# Patient Record
Sex: Female | Born: 1945 | Race: White | Hispanic: No | Marital: Married | Smoking: Never smoker
Health system: Southern US, Community
[De-identification: ages and names within clinical notes are randomized; demographics above are authoritative.]

## PROBLEM LIST (undated history)

## (undated) DIAGNOSIS — R55 Syncope and collapse: Secondary | ICD-10-CM

## (undated) DIAGNOSIS — C50919 Malignant neoplasm of unspecified site of unspecified female breast: Secondary | ICD-10-CM

## (undated) DIAGNOSIS — I10 Essential (primary) hypertension: Secondary | ICD-10-CM

## (undated) DIAGNOSIS — I4892 Unspecified atrial flutter: Secondary | ICD-10-CM

## (undated) HISTORY — PX: TONSILLECTOMY: SUR1361

## (undated) HISTORY — PX: BREAST SURGERY: SHX581

## (undated) HISTORY — PX: VEIN LIGATION: SHX2652

## (undated) HISTORY — PX: MOUTH SURGERY: SHX715

## (undated) HISTORY — DX: Essential (primary) hypertension: I10

---

## 1998-03-10 ENCOUNTER — Ambulatory Visit (HOSPITAL_COMMUNITY): Admission: RE | Admit: 1998-03-10 | Discharge: 1998-03-10 | Payer: Self-pay | Admitting: Surgery

## 1998-04-10 ENCOUNTER — Encounter: Admission: RE | Admit: 1998-04-10 | Discharge: 1998-07-09 | Payer: Self-pay | Admitting: Radiation Oncology

## 2004-02-01 ENCOUNTER — Encounter: Admission: RE | Admit: 2004-02-01 | Discharge: 2004-02-01 | Payer: Self-pay | Admitting: Family Medicine

## 2006-11-13 ENCOUNTER — Encounter: Admission: RE | Admit: 2006-11-13 | Discharge: 2006-11-13 | Payer: Self-pay | Admitting: Family Medicine

## 2008-11-08 ENCOUNTER — Other Ambulatory Visit: Admission: RE | Admit: 2008-11-08 | Discharge: 2008-11-08 | Payer: Self-pay | Admitting: Family Medicine

## 2009-11-14 ENCOUNTER — Other Ambulatory Visit: Admission: RE | Admit: 2009-11-14 | Discharge: 2009-11-14 | Payer: Self-pay | Admitting: Family Medicine

## 2010-02-21 ENCOUNTER — Encounter: Admission: RE | Admit: 2010-02-21 | Discharge: 2010-02-21 | Payer: Self-pay | Admitting: Internal Medicine

## 2010-05-08 ENCOUNTER — Ambulatory Visit (HOSPITAL_COMMUNITY): Admission: RE | Admit: 2010-05-08 | Discharge: 2010-05-08 | Payer: Self-pay | Admitting: Internal Medicine

## 2010-11-22 ENCOUNTER — Other Ambulatory Visit
Admission: RE | Admit: 2010-11-22 | Discharge: 2010-11-22 | Payer: Self-pay | Source: Home / Self Care | Admitting: Family Medicine

## 2011-01-07 ENCOUNTER — Encounter: Payer: Self-pay | Admitting: Family Medicine

## 2012-08-21 ENCOUNTER — Ambulatory Visit (INDEPENDENT_AMBULATORY_CARE_PROVIDER_SITE_OTHER): Payer: BC Managed Care – PPO | Admitting: Family Medicine

## 2012-08-21 DIAGNOSIS — Z23 Encounter for immunization: Secondary | ICD-10-CM

## 2012-11-24 ENCOUNTER — Emergency Department (HOSPITAL_BASED_OUTPATIENT_CLINIC_OR_DEPARTMENT_OTHER)
Admission: EM | Admit: 2012-11-24 | Discharge: 2012-11-24 | Disposition: A | Discharge status: Home / Self Care | Payer: Medicare Other | Attending: Emergency Medicine | Admitting: Emergency Medicine

## 2012-11-24 ENCOUNTER — Encounter (HOSPITAL_BASED_OUTPATIENT_CLINIC_OR_DEPARTMENT_OTHER): Payer: Self-pay | Admitting: *Deleted

## 2012-11-24 DIAGNOSIS — Z0000 Encounter for general adult medical examination without abnormal findings: Secondary | ICD-10-CM

## 2012-11-24 DIAGNOSIS — Z853 Personal history of malignant neoplasm of breast: Secondary | ICD-10-CM | POA: Insufficient documentation

## 2012-11-24 DIAGNOSIS — Z008 Encounter for other general examination: Secondary | ICD-10-CM | POA: Insufficient documentation

## 2012-11-24 HISTORY — DX: Malignant neoplasm of unspecified site of unspecified female breast: C50.919

## 2012-11-24 LAB — BASIC METABOLIC PANEL
Calcium: 10.2 mg/dL (ref 8.4–10.5)
Chloride: 102 mEq/L (ref 96–112)
Creatinine, Ser: 0.7 mg/dL (ref 0.50–1.10)
GFR calc Af Amer: >90 mL/min (ref >90)
GFR calc non Af Amer: 88 mL/min — ABNORMAL LOW (ref >90)

## 2012-11-24 NOTE — ED Notes (Signed)
States she had her pre physical lab work done 3 days ago which showed an elevated potassium. Today she had it repeated which still showed an elevation in her potassium. She is asymptomatic. Was told to come to the ED for treatment.

## 2012-11-24 NOTE — ED Notes (Signed)
Pt is still in triage, not in room 9 at this time.

## 2012-11-24 NOTE — ED Provider Notes (Signed)
History  This chart was scribed for Kaijah Abts B. Bernette Mayers, MD by Ardeen Jourdain, ED Scribe. This patient was seen in room MH09/MH09 and the patient's care was started at 1555.  CSN: [id number]  Arrival date & time 11/24/12  1506   First MD Initiated Contact with Patient 11/24/12 1555      Chief Complaint  Patient presents with  . Follow-up     The history is provided by the patient. No language interpreter was used.    Alexa Hansen is a 66 y.o. female who presents to the Emergency Department complaining of follow up from an elevated potassium result. She reports having her blood drawn 3 days ago and again earlier today, and was told to come to the ED due to the elevated levels. She states she was seen for a physical 3 days ago. She states she has an elevated potassium result at that time and was told to be rechecked today. She states she was flexing and squeezing her hand when her blood was being drawn. She denies any other symptoms at this time including weakness, palpitations and SOB. No prior history of same.    Past Medical History  Diagnosis Date  . Breast cancer     Past Surgical History  Procedure Date  . Tonsillectomy   . Breast surgery     No family history on file.  History  Substance Use Topics  . Smoking status: Never Smoker   . Smokeless tobacco: Not on file  . Alcohol Use: No   No OB history available.   Review of Systems  All other systems reviewed and are negative.   A complete 10 system review of systems was obtained and all systems are negative except as noted in the HPI and PMH.    Allergies  Review of patient's allergies indicates no known allergies.  Home Medications   Current Outpatient Rx  Name  Route  Sig  Dispense  Refill  . BIOTIN PO   Oral   Take by mouth.         Marland Kitchen CALCIUM + D PO   Oral   Take by mouth.           Triage Vitals: BP 144/73  Pulse 102  Temp 98 F (36.7 C) (Oral)  Resp 20  SpO2 100%  Physical Exam   Constitutional: She is oriented to person, place, and time. She appears well-developed and well-nourished.  HENT:  Head: Normocephalic and atraumatic.  Neck: Neck supple.  Pulmonary/Chest: Effort normal.  Neurological: She is alert and oriented to person, place, and time. No cranial nerve deficit.  Psychiatric: She has a normal mood and affect. Her behavior is normal.    ED Course  Procedures (including critical care time)  DIAGNOSTIC STUDIES: Oxygen Saturation is 100% on room air, normal by my interpretation.    COORDINATION OF CARE:  3:57 PM: Lab done here is normal. Suspect lab error caused by vigorous hand squeezing during blood draw. EKG is also normal and she is asymptomatic here. Advised routine PCP followup.    Results for orders placed during the hospital encounter of 11/24/12  BASIC METABOLIC PANEL      Component Value Range   Sodium 140  135 - 145 mEq/L   Potassium 4.2  3.5 - 5.1 mEq/L   Chloride 102  96 - 112 mEq/L   CO2 28  19 - 32 mEq/L   Glucose, Bld 133 (*) 70 - 99 mg/dL   BUN 13  6 - 23 mg/dL   Creatinine, Ser 1.61  0.50 - 1.10 mg/dL   Calcium 09.6  8.4 - 04.5 mg/dL   GFR calc non Af Amer 88 (*) >90 mL/min   GFR calc Af Amer >90  >90 mL/min    No results found.   No diagnosis found.    MDM   Date: 11/24/2012  Rate: 82  Rhythm: normal sinus rhythm  QRS Axis: normal  Intervals: normal  ST/T Wave abnormalities: normal  Conduction Disutrbances: none  Narrative Interpretation: unremarkable         I personally performed the services described in this documentation, which was scribed in my presence. The recorded information has been reviewed and is accurate.     Lakshmi Sundeen B. Bernette Mayers, MD 11/24/12 [phone]

## 2012-11-26 ENCOUNTER — Other Ambulatory Visit (HOSPITAL_COMMUNITY)
Admission: RE | Admit: 2012-11-26 | Discharge: 2012-11-26 | Disposition: A | Discharge status: Home / Self Care | Payer: Medicare Other | Source: Ambulatory Visit | Attending: Family Medicine | Admitting: Family Medicine

## 2012-11-26 ENCOUNTER — Other Ambulatory Visit: Payer: Self-pay | Admitting: Family Medicine

## 2012-11-26 DIAGNOSIS — Z124 Encounter for screening for malignant neoplasm of cervix: Secondary | ICD-10-CM | POA: Insufficient documentation

## 2013-07-22 ENCOUNTER — Other Ambulatory Visit: Payer: Self-pay

## 2013-09-09 ENCOUNTER — Ambulatory Visit (INDEPENDENT_AMBULATORY_CARE_PROVIDER_SITE_OTHER): Payer: Medicare Other | Admitting: Family Medicine

## 2013-09-09 DIAGNOSIS — Z23 Encounter for immunization: Secondary | ICD-10-CM

## 2014-09-16 ENCOUNTER — Ambulatory Visit (INDEPENDENT_AMBULATORY_CARE_PROVIDER_SITE_OTHER): Payer: Medicare HMO | Admitting: *Deleted

## 2014-09-16 DIAGNOSIS — Z23 Encounter for immunization: Secondary | ICD-10-CM

## 2015-06-13 ENCOUNTER — Other Ambulatory Visit: Payer: Self-pay

## 2015-09-20 ENCOUNTER — Ambulatory Visit (INDEPENDENT_AMBULATORY_CARE_PROVIDER_SITE_OTHER): Payer: Medicare HMO | Admitting: *Deleted

## 2015-09-20 DIAGNOSIS — Z23 Encounter for immunization: Secondary | ICD-10-CM | POA: Diagnosis not present

## 2015-10-06 ENCOUNTER — Encounter: Payer: Self-pay | Admitting: Family Medicine

## 2015-10-06 ENCOUNTER — Ambulatory Visit (INDEPENDENT_AMBULATORY_CARE_PROVIDER_SITE_OTHER): Payer: Medicare HMO | Admitting: Family Medicine

## 2015-10-06 VITALS — BP 130/70 | HR 83 | Temp 97.8°F | Resp 18 | Ht 63.0 in | Wt 143.6 lb

## 2015-10-06 DIAGNOSIS — L304 Erythema intertrigo: Secondary | ICD-10-CM

## 2015-10-06 MED ORDER — CLOTRIMAZOLE-BETAMETHASONE 1-0.05 % EX CREA: 1.0000 "application " | TOPICAL_CREAM | Freq: Two times a day (BID) | CUTANEOUS | Status: DC

## 2015-10-06 NOTE — Patient Instructions (Signed)
Eczema Eczema, also called atopic dermatitis, is a skin disorder that causes inflammation of the skin. It causes a red rash and dry, scaly skin. The skin becomes very itchy. Eczema is generally worse during the cooler winter months and often improves with the warmth of summer. Eczema usually starts showing signs in infancy. Some children outgrow eczema, but it may last through adulthood.  CAUSES  The exact cause of eczema is not known, but it appears to run in families. People with eczema often have a family history of eczema, allergies, asthma, or hay fever. Eczema is not contagious. Flare-ups of the condition may be caused by:   Contact with something you are sensitive or allergic to.   Stress. SIGNS AND SYMPTOMS  Dry, scaly skin.   Red, itchy rash.   Itchiness. This may occur before the skin rash and may be very intense.  DIAGNOSIS  The diagnosis of eczema is usually made based on symptoms and medical history. TREATMENT  Eczema cannot be cured, but symptoms usually can be controlled with treatment and other strategies. A treatment plan might include:  Controlling the itching and scratching.   Use over-the-counter antihistamines as directed for itching. This is especially useful at night when the itching tends to be worse.   Use over-the-counter steroid creams as directed for itching.   Avoid scratching. Scratching makes the rash and itching worse. It may also result in a skin infection (impetigo) due to a break in the skin caused by scratching.   Keeping the skin well moisturized with creams every day. This will seal in moisture and help prevent dryness. Lotions that contain alcohol and water should be avoided because they can dry the skin.   Limiting exposure to things that you are sensitive or allergic to (allergens).   Recognizing situations that cause stress.   Developing a plan to manage stress.  HOME CARE INSTRUCTIONS   Only take over-the-counter or  prescription medicines as directed by your health care provider.   Do not use anything on the skin without checking with your health care provider.   Keep baths or showers short (5 minutes) in warm (not hot) water. Use mild cleansers for bathing. These should be unscented. You may add nonperfumed bath oil to the bath water. It is best to avoid soap and bubble bath.   Immediately after a bath or shower, when the skin is still damp, apply a moisturizing ointment to the entire body. This ointment should be a petroleum ointment. This will seal in moisture and help prevent dryness. The thicker the ointment, the better. These should be unscented.   Keep fingernails cut short. Children with eczema may need to wear soft gloves or mittens at night after applying an ointment.   Dress in clothes made of cotton or cotton blends. Dress lightly, because heat increases itching.   A child with eczema should stay away from anyone with fever blisters or cold sores. The virus that causes fever blisters (herpes simplex) can cause a serious skin infection in children with eczema. SEEK MEDICAL CARE IF:   Your itching interferes with sleep.   Your rash gets worse or is not better within 1 week after starting treatment.   You see pus or soft yellow scabs in the rash area.   You have a fever.   You have a rash flare-up after contact with someone who has fever blisters.    This information is not intended to replace advice given to you by your health care   provider. Make sure you discuss any questions you have with your health care provider.   Document Released: 11/30/2000 Document Revised: 09/23/2013 Document Reviewed: 07/06/2013 Elsevier Interactive Patient Education 2016 [employer] Inc.  

## 2015-10-06 NOTE — Progress Notes (Signed)
Subjective:  This chart was scribed for Robyn Haber MD, by Tamsen Roers, at [facility].  This patient was seen in room 3 and the patient's care was started at 2:42 PM.   Chief Complaint  Patient presents with   itching scalp    3-4 weeks     Patient ID: Alexa Hansen, female    DOB: [birth year], 69 y.o.   MRN: [id number]  HPI  HPI Comments: Alexa Hansen is a 69 y.o. female who presents to the [facility] complaining of an itchy scalp behind her ears onset 3-4 weeks ago. Patient notes that she was not able to sleep last night as it was bothering her.  She has no other questions or concerns today. Patient is retired and used to work at a [employer].  She states that she is currently staying active and keeps busy. Patient wears glasses.   There are no active problems to display for this patient.  Past Medical History  Diagnosis Date   Breast cancer [facility])    Past Surgical History  Procedure Laterality Date   Tonsillectomy     Breast surgery     No Known Allergies Prior to Admission medications   Medication Sig Start Date End Date Taking? Authorizing Provider  alendronate (FOSAMAX) 10 MG tablet Take 10 mg by mouth once a week. Take with a full glass of water on an empty stomach.   Yes Historical Provider, MD  BIOTIN PO Take by mouth.   Yes Historical Provider, MD  Calcium Carbonate-Vitamin D (CALCIUM + D PO) Take by mouth.   Yes Historical Provider, MD   Social History   Social History   Marital Status: Married    Spouse Name: N/A   Number of Children: N/A   Years of Education: N/A   Occupational History   Not on file.   Social History Main Topics   Smoking status: Never Smoker    Smokeless tobacco: Not on file   Alcohol Use: No   Drug Use: No   Sexual Activity: Not on file   Other Topics Concern   Not on file   Social History Narrative     Review of Systems  Constitutional: Negative for  fever and chills.  Eyes: Negative for pain, redness and itching.  Respiratory: Negative for cough, choking and shortness of breath.   Gastrointestinal: Negative for nausea, vomiting, diarrhea and constipation.  Musculoskeletal: Negative for neck pain and neck stiffness.  Skin: Positive for rash.       Objective:   Physical Exam  Constitutional: She is oriented to person, place, and time. She appears well-developed and well-nourished. No distress.  HENT:  Head: Normocephalic and atraumatic.  Eyes: Pupils are equal, round, and reactive to light.  Neck: Normal range of motion.  Pulmonary/Chest: No respiratory distress.  Neurological: She is alert and oriented to person, place, and time.  Skin: Skin is warm and dry.   erythema behind the left ear and scaly-ness with a sharp well demarcated border.    Filed Vitals:   10/06/15 1417  BP: 130/70  Pulse: 83  Temp: 97.8 F (36.6 C)  TempSrc: Oral  Resp: 18  Height: 5\' 3"  (1.6 m)  Weight: 143 lb 9.6 oz (65.137 kg)  SpO2: 98%       Assessment & Plan:   This chart was scribed in my presence and reviewed by me personally.    ICD-9-CM ICD-10-CM   1. Eczema  intertrigo 695.89 L30.4 clotrimazole-betamethasone (LOTRISONE) cream     Signed, Robyn Haber, MD

## 2015-10-14 ENCOUNTER — Ambulatory Visit (INDEPENDENT_AMBULATORY_CARE_PROVIDER_SITE_OTHER): Payer: Medicare HMO | Admitting: Emergency Medicine

## 2015-10-14 VITALS — BP 140/78 | HR 74 | Temp 98.4°F | Resp 16 | Ht 63.0 in | Wt 142.8 lb

## 2015-10-14 DIAGNOSIS — L309 Dermatitis, unspecified: Secondary | ICD-10-CM | POA: Diagnosis not present

## 2015-10-14 MED ORDER — TRIAMCINOLONE ACETONIDE 0.1 % EX CREA: 1.0000 "application " | TOPICAL_CREAM | Freq: Two times a day (BID) | CUTANEOUS | Status: DC

## 2015-10-14 MED ORDER — METHYLPREDNISOLONE ACETATE 80 MG/ML IJ SUSP
120.0000 mg | Freq: Once | INTRAMUSCULAR | Status: AC
  Administered 2015-10-14: 120 mg via INTRAMUSCULAR

## 2015-10-14 NOTE — Progress Notes (Signed)
Subjective:  Patient ID: Alexa Hansen, female    DOB: 29-Jan-1946  Age: 69 y.o. MRN: [id number]  CC: Follow-up and Rash   HPI Alexa Hansen presents  with exacerbation of rash. She was treated previously for eczema and that was resolved and now she's had a recurrence. She said it started on the back of her ear. And spread to her scalp now she has a rash on both forearms. She denies any contact with anyone else and socket rash. She denies any new soap shampoo fabrics offered to her detergent lotion or other personal care products no new food or medication.  History Alexa Hansen has a past medical history of Breast cancer ([place]).   She has past surgical history that includes Tonsillectomy and Breast surgery.   Her  family history is not on file.  She   reports that she has never smoked. She does not have any smokeless tobacco history on file. She reports that she does not drink alcohol or use illicit drugs.  Outpatient Prescriptions Prior to Visit  Medication Sig Dispense Refill  . alendronate (FOSAMAX) 10 MG tablet Take 10 mg by mouth once a week. Take with a full glass of water on an empty stomach.    Marland Kitchen BIOTIN PO Take by mouth.    . Calcium Carbonate-Vitamin D (CALCIUM + D PO) Take by mouth.    . clotrimazole-betamethasone (LOTRISONE) cream Apply 1 application topically 2 (two) times daily. 30 g 0   No facility-administered medications prior to visit.    Social History   Social History  . Marital Status: Married    Spouse Name: N/A  . Number of Children: N/A  . Years of Education: N/A   Social History Main Topics  . Smoking status: Never Smoker   . Smokeless tobacco: None  . Alcohol Use: No  . Drug Use: No  . Sexual Activity: Not Asked   Other Topics Concern  . None   Social History Narrative     Review of Systems  Constitutional: Negative for fever, chills and appetite change.  HENT: Negative for congestion, ear pain, postnasal drip, sinus pressure and sore throat.     Eyes: Negative for pain and redness.  Respiratory: Negative for cough, shortness of breath and wheezing.   Cardiovascular: Negative for leg swelling.  Gastrointestinal: Negative for nausea, vomiting, abdominal pain, diarrhea, constipation and blood in stool.  Endocrine: Negative for polyuria.  Genitourinary: Negative for dysuria, urgency, frequency and flank pain.  Musculoskeletal: Negative for gait problem.  Skin: Positive for rash.  Neurological: Negative for weakness and headaches.  Psychiatric/Behavioral: Negative for confusion and decreased concentration. The patient is not nervous/anxious.     Objective:  BP 140/78 mmHg  Pulse 74  Temp(Src) 98.4 F (36.9 C) (Oral)  Resp 16  Ht 5\' 3"  (1.6 m)  Wt 142 lb 12.8 oz (64.774 kg)  BMI 25.30 kg/m2  SpO2 98%  Physical Exam  Constitutional: She is oriented to person, place, and time. She appears well-developed and well-nourished.  HENT:  Head: Normocephalic and atraumatic.  Eyes: Conjunctivae are normal. Pupils are equal, round, and reactive to light.  Pulmonary/Chest: Effort normal.  Musculoskeletal: She exhibits no edema.  Neurological: She is alert and oriented to person, place, and time.  Skin: Skin is dry. Rash noted.  Psychiatric: She has a normal mood and affect. Her behavior is normal. Thought content normal.   He has a rash characteristic of eczema on her arms and back or neck  Assessment & Plan:   Alexa Hansen was seen today for follow-up and rash.  Diagnoses and all orders for this visit:  Eczema -     methylPREDNISolone acetate (DEPO-MEDROL) injection 120 mg; Inject 1.5 mLs (120 mg total) into the muscle once.  Other orders -     triamcinolone cream (KENALOG) 0.1 %; Apply 1 application topically 2 (two) times daily.   I am having Ms. Sole start on triamcinolone cream. I am also having her maintain her Calcium Carbonate-Vitamin D (CALCIUM + D PO), BIOTIN PO, alendronate, and clotrimazole-betamethasone. We  administered methylPREDNISolone acetate.  Meds ordered this encounter  Medications  . methylPREDNISolone acetate (DEPO-MEDROL) injection 120 mg    Sig:   . triamcinolone cream (KENALOG) 0.1 %    Sig: Apply 1 application topically 2 (two) times daily.    Dispense:  30 g    Refill:  2    Appropriate red flag conditions were discussed with the patient as well as actions that should be taken.  Patient expressed his understanding.  Follow-up: Return if symptoms worsen or fail to improve.  Roselee Culver, MD

## 2015-10-14 NOTE — Patient Instructions (Signed)
Eczema Eczema, also called atopic dermatitis, is a skin disorder that causes inflammation of the skin. It causes a red rash and dry, scaly skin. The skin becomes very itchy. Eczema is generally worse during the cooler winter months and often improves with the warmth of summer. Eczema usually starts showing signs in infancy. Some children outgrow eczema, but it may last through adulthood.  CAUSES  The exact cause of eczema is not known, but it appears to run in families. People with eczema often have a family history of eczema, allergies, asthma, or hay fever. Eczema is not contagious. Flare-ups of the condition may be caused by:   Contact with something you are sensitive or allergic to.   Stress. SIGNS AND SYMPTOMS  Dry, scaly skin.   Red, itchy rash.   Itchiness. This may occur before the skin rash and may be very intense.  DIAGNOSIS  The diagnosis of eczema is usually made based on symptoms and medical history. TREATMENT  Eczema cannot be cured, but symptoms usually can be controlled with treatment and other strategies. A treatment plan might include:  Controlling the itching and scratching.   Use over-the-counter antihistamines as directed for itching. This is especially useful at night when the itching tends to be worse.   Use over-the-counter steroid creams as directed for itching.   Avoid scratching. Scratching makes the rash and itching worse. It may also result in a skin infection (impetigo) due to a break in the skin caused by scratching.   Keeping the skin well moisturized with creams every day. This will seal in moisture and help prevent dryness. Lotions that contain alcohol and water should be avoided because they can dry the skin.   Limiting exposure to things that you are sensitive or allergic to (allergens).   Recognizing situations that cause stress.   Developing a plan to manage stress.  HOME CARE INSTRUCTIONS   Only take over-the-counter or  prescription medicines as directed by your health care provider.   Do not use anything on the skin without checking with your health care provider.   Keep baths or showers short (5 minutes) in warm (not hot) water. Use mild cleansers for bathing. These should be unscented. You may add nonperfumed bath oil to the bath water. It is best to avoid soap and bubble bath.   Immediately after a bath or shower, when the skin is still damp, apply a moisturizing ointment to the entire body. This ointment should be a petroleum ointment. This will seal in moisture and help prevent dryness. The thicker the ointment, the better. These should be unscented.   Keep fingernails cut short. Children with eczema may need to wear soft gloves or mittens at night after applying an ointment.   Dress in clothes made of cotton or cotton blends. Dress lightly, because heat increases itching.   A child with eczema should stay away from anyone with fever blisters or cold sores. The virus that causes fever blisters (herpes simplex) can cause a serious skin infection in children with eczema. SEEK MEDICAL CARE IF:   Your itching interferes with sleep.   Your rash gets worse or is not better within 1 week after starting treatment.   You see pus or soft yellow scabs in the rash area.   You have a fever.   You have a rash flare-up after contact with someone who has fever blisters.    This information is not intended to replace advice given to you by your health care   provider. Make sure you discuss any questions you have with your health care provider.   Document Released: 11/30/2000 Document Revised: 09/23/2013 Document Reviewed: 07/06/2013 Elsevier Interactive Patient Education 2016 [employer] Inc.  

## 2015-11-07 DIAGNOSIS — L821 Other seborrheic keratosis: Secondary | ICD-10-CM | POA: Diagnosis not present

## 2015-11-07 DIAGNOSIS — L812 Freckles: Secondary | ICD-10-CM | POA: Diagnosis not present

## 2015-11-07 DIAGNOSIS — D1801 Hemangioma of skin and subcutaneous tissue: Secondary | ICD-10-CM | POA: Diagnosis not present

## 2015-11-07 DIAGNOSIS — L218 Other seborrheic dermatitis: Secondary | ICD-10-CM | POA: Diagnosis not present

## 2015-11-07 DIAGNOSIS — D225 Melanocytic nevi of trunk: Secondary | ICD-10-CM | POA: Diagnosis not present

## 2015-12-21 DIAGNOSIS — H40023 Open angle with borderline findings, high risk, bilateral: Secondary | ICD-10-CM | POA: Diagnosis not present

## 2015-12-29 DIAGNOSIS — R69 Illness, unspecified: Secondary | ICD-10-CM | POA: Diagnosis not present

## 2016-01-03 DIAGNOSIS — M85839 Other specified disorders of bone density and structure, unspecified forearm: Secondary | ICD-10-CM | POA: Diagnosis not present

## 2016-01-03 DIAGNOSIS — M810 Age-related osteoporosis without current pathological fracture: Secondary | ICD-10-CM | POA: Diagnosis not present

## 2016-01-05 DIAGNOSIS — R69 Illness, unspecified: Secondary | ICD-10-CM | POA: Diagnosis not present

## 2016-02-15 DIAGNOSIS — Z136 Encounter for screening for cardiovascular disorders: Secondary | ICD-10-CM | POA: Diagnosis not present

## 2016-02-15 DIAGNOSIS — Z13220 Encounter for screening for lipoid disorders: Secondary | ICD-10-CM | POA: Diagnosis not present

## 2016-02-15 DIAGNOSIS — M810 Age-related osteoporosis without current pathological fracture: Secondary | ICD-10-CM | POA: Diagnosis not present

## 2016-02-15 DIAGNOSIS — Z0000 Encounter for general adult medical examination without abnormal findings: Secondary | ICD-10-CM | POA: Diagnosis not present

## 2016-02-15 DIAGNOSIS — Z853 Personal history of malignant neoplasm of breast: Secondary | ICD-10-CM | POA: Diagnosis not present

## 2016-02-21 DIAGNOSIS — R55 Syncope and collapse: Secondary | ICD-10-CM | POA: Diagnosis not present

## 2016-02-21 DIAGNOSIS — Z1389 Encounter for screening for other disorder: Secondary | ICD-10-CM | POA: Diagnosis not present

## 2016-02-21 DIAGNOSIS — M8588 Other specified disorders of bone density and structure, other site: Secondary | ICD-10-CM | POA: Diagnosis not present

## 2016-02-21 DIAGNOSIS — Z1211 Encounter for screening for malignant neoplasm of colon: Secondary | ICD-10-CM | POA: Diagnosis not present

## 2016-02-21 DIAGNOSIS — R030 Elevated blood-pressure reading, without diagnosis of hypertension: Secondary | ICD-10-CM | POA: Diagnosis not present

## 2016-02-21 DIAGNOSIS — Z0000 Encounter for general adult medical examination without abnormal findings: Secondary | ICD-10-CM | POA: Diagnosis not present

## 2016-02-21 DIAGNOSIS — Z853 Personal history of malignant neoplasm of breast: Secondary | ICD-10-CM | POA: Diagnosis not present

## 2016-02-22 ENCOUNTER — Encounter: Payer: Self-pay | Admitting: Internal Medicine

## 2016-03-20 DIAGNOSIS — I10 Essential (primary) hypertension: Secondary | ICD-10-CM | POA: Diagnosis not present

## 2016-03-20 DIAGNOSIS — Z79899 Other long term (current) drug therapy: Secondary | ICD-10-CM | POA: Diagnosis not present

## 2016-04-16 DIAGNOSIS — R69 Illness, unspecified: Secondary | ICD-10-CM | POA: Diagnosis not present

## 2016-04-17 DIAGNOSIS — Z0000 Encounter for general adult medical examination without abnormal findings: Secondary | ICD-10-CM | POA: Diagnosis not present

## 2016-04-17 DIAGNOSIS — M810 Age-related osteoporosis without current pathological fracture: Secondary | ICD-10-CM | POA: Diagnosis not present

## 2016-04-17 DIAGNOSIS — I10 Essential (primary) hypertension: Secondary | ICD-10-CM | POA: Diagnosis not present

## 2016-04-18 ENCOUNTER — Encounter: Payer: Medicare Other | Admitting: Internal Medicine

## 2016-05-11 ENCOUNTER — Encounter: Payer: Self-pay | Admitting: Family Medicine

## 2016-05-16 DIAGNOSIS — Z853 Personal history of malignant neoplasm of breast: Secondary | ICD-10-CM | POA: Diagnosis not present

## 2016-05-16 DIAGNOSIS — Z1231 Encounter for screening mammogram for malignant neoplasm of breast: Secondary | ICD-10-CM | POA: Diagnosis not present

## 2016-07-19 ENCOUNTER — Encounter: Payer: Self-pay | Admitting: Internal Medicine

## 2016-08-07 DIAGNOSIS — H524 Presbyopia: Secondary | ICD-10-CM | POA: Diagnosis not present

## 2016-08-07 DIAGNOSIS — H35033 Hypertensive retinopathy, bilateral: Secondary | ICD-10-CM | POA: Diagnosis not present

## 2016-08-07 DIAGNOSIS — H40023 Open angle with borderline findings, high risk, bilateral: Secondary | ICD-10-CM | POA: Diagnosis not present

## 2016-08-07 DIAGNOSIS — Z0100 Encounter for examination of eyes and vision without abnormal findings: Secondary | ICD-10-CM | POA: Diagnosis not present

## 2016-08-08 DIAGNOSIS — Z0100 Encounter for examination of eyes and vision without abnormal findings: Secondary | ICD-10-CM | POA: Diagnosis not present

## 2016-09-21 ENCOUNTER — Encounter: Payer: Self-pay | Admitting: Internal Medicine

## 2016-09-21 ENCOUNTER — Ambulatory Visit (AMBULATORY_SURGERY_CENTER): Payer: Self-pay

## 2016-09-21 VITALS — Ht 62.0 in | Wt 141.8 lb

## 2016-09-21 DIAGNOSIS — Z1211 Encounter for screening for malignant neoplasm of colon: Secondary | ICD-10-CM

## 2016-09-21 NOTE — Progress Notes (Signed)
No allergies to eggs or soy No diet meds No home oxygen No past problems with anesthesia  Registered for emmi 

## 2016-09-27 ENCOUNTER — Ambulatory Visit (INDEPENDENT_AMBULATORY_CARE_PROVIDER_SITE_OTHER): Payer: Medicare HMO | Admitting: Physician Assistant

## 2016-09-27 DIAGNOSIS — Z23 Encounter for immunization: Secondary | ICD-10-CM | POA: Diagnosis not present

## 2016-10-02 ENCOUNTER — Encounter: Payer: Self-pay | Admitting: Internal Medicine

## 2016-10-02 ENCOUNTER — Ambulatory Visit (AMBULATORY_SURGERY_CENTER): Payer: Medicare HMO | Admitting: Internal Medicine

## 2016-10-02 VITALS — BP 107/38 | HR 57 | Temp 96.2°F | Resp 14 | Ht 62.0 in | Wt 141.0 lb

## 2016-10-02 DIAGNOSIS — Z1211 Encounter for screening for malignant neoplasm of colon: Secondary | ICD-10-CM | POA: Diagnosis not present

## 2016-10-02 DIAGNOSIS — Z1212 Encounter for screening for malignant neoplasm of rectum: Secondary | ICD-10-CM | POA: Diagnosis not present

## 2016-10-02 DIAGNOSIS — I10 Essential (primary) hypertension: Secondary | ICD-10-CM | POA: Diagnosis not present

## 2016-10-02 MED ORDER — SODIUM CHLORIDE 0.9 % IV SOLN: 500.0000 mL | INTRAVENOUS | Status: DC

## 2016-10-02 NOTE — Patient Instructions (Addendum)
No polyps seen. No cancer seen.  You do have diverticulosis - thickened muscle rings and pouches in the colon wall. Please read the handout about this condition. Also saw hemorrhoids.  I do not recommend further routine repeat colon cancer screening based upon your history, age and findings.  I appreciate the opportunity to care for you. Gatha Mayer, MD, FACG  YOU HAD AN ENDOSCOPIC PROCEDURE TODAY AT [facility]:   Refer to the procedure report that was given to you for any specific questions about what was found during the examination.  If the procedure report does not answer your questions, please call your gastroenterologist to clarify.  If you requested that your care partner not be given the details of your procedure findings, then the procedure report has been included in a sealed envelope for you to review at your convenience later.  YOU SHOULD EXPECT: Some feelings of bloating in the abdomen. Passage of more gas than usual.  Walking can help get rid of the air that was put into your GI tract during the procedure and reduce the bloating. If you had a lower endoscopy (such as a colonoscopy or flexible sigmoidoscopy) you may notice spotting of blood in your stool or on the toilet paper. If you underwent a bowel prep for your procedure, you may not have a normal bowel movement for a few days.  Please Note:  You might notice some irritation and congestion in your nose or some drainage.  This is from the oxygen used during your procedure.  There is no need for concern and it should clear up in a day or so.  SYMPTOMS TO REPORT IMMEDIATELY:   Following lower endoscopy (colonoscopy or flexible sigmoidoscopy):  Excessive amounts of blood in the stool  Significant tenderness or worsening of abdominal pains  Swelling of the abdomen that is new, acute  Fever of 100F or higher   For urgent or emergent issues, a gastroenterologist can be reached at any hour by  calling [phone].   DIET:  We do recommend a small meal at first, but then you may proceed to your regular diet.  Drink plenty of fluids but you should avoid alcoholic beverages for 24 hours.  ACTIVITY:  You should plan to take it easy for the rest of today and you should NOT DRIVE or use heavy machinery until tomorrow (because of the sedation medicines used during the test).    FOLLOW UP: Our staff will call the number listed on your records the next business day following your procedure to check on you and address any questions or concerns that you may have regarding the information given to you following your procedure. If we do not reach you, we will leave a message.  However, if you are feeling well and you are not experiencing any problems, there is no need to return our call.  We will assume that you have returned to your regular daily activities without incident.  If any biopsies were taken you will be contacted by phone or by letter within the next 1-3 weeks.  Please call us at [phone] if you have not heard about the biopsies in 3 weeks.    SIGNATURES/CONFIDENTIALITY: You and/or your care partner have signed paperwork which will be entered into your electronic medical record.  These signatures attest to the fact that that the information above on your After Visit Summary has been reviewed and is understood.  Full responsibility of the confidentiality of  this discharge information lies with you and/or your care-partner.

## 2016-10-02 NOTE — Progress Notes (Signed)
To recovery, report to Tyrell, RN, VSS. 

## 2016-10-02 NOTE — Op Note (Signed)
[facility] Patient Name: Alexa Hansen Procedure Date: 10/02/2016 11:27 AM MRN: [id number] Endoscopist: Gatha Mayer , MD Age: 70 Referring MD:  Date of Birth: 09/01/1946 Gender: Female Account #: [id number] Procedure:                Colonoscopy Indications:              Screening for colorectal malignant neoplasm, This                            is the patient's first colonoscopy Medicines:                Propofol per Anesthesia, Monitored Anesthesia Care Procedure:                Pre-Anesthesia Assessment:                           - Prior to the procedure, a History and Physical                            was performed, and patient medications and                            allergies were reviewed. The patient's tolerance of                            previous anesthesia was also reviewed. The risks                            and benefits of the procedure and the sedation                            options and risks were discussed with the patient.                            All questions were answered, and informed consent                            was obtained. Prior Anticoagulants: The patient has                            taken no previous anticoagulant or antiplatelet                            agents. ASA Grade Assessment: II - A patient with                            mild systemic disease. After reviewing the risks                            and benefits, the patient was deemed in                            satisfactory condition to undergo the procedure.  After obtaining informed consent, the colonoscope                            was passed under direct vision. Throughout the                            procedure, the patient's blood pressure, pulse, and                            oxygen saturations were monitored continuously. The                            Model CF-HQ190L [id number]) scope was introduced   through the anus and advanced to the the cecum,                            identified by appendiceal orifice and ileocecal                            valve. The colonoscopy was somewhat difficult due                            to a tortuous colon. Successful completion of the                            procedure was aided by straightening and shortening                            the scope to obtain bowel loop reduction. The                            patient tolerated the procedure well. The quality                            of the bowel preparation was good. The bowel                            preparation used was Miralax. The ileocecal valve,                            appendiceal orifice, and rectum were photographed. Scope In: 11:36:09 AM Scope Out: 11:48:51 AM Scope Withdrawal Time: 0 hours 9 minutes 2 seconds  Total Procedure Duration: 0 hours 12 minutes 42 seconds  Findings:                 The perianal and digital rectal examinations were                            normal.                           Multiple diverticula were found in the left colon.                           External and  internal hemorrhoids were found during                            retroflexion.                           The exam was otherwise without abnormality on                            direct and retroflexion views. Complications:            No immediate complications. Estimated Blood Loss:     Estimated blood loss: none. Impression:               - Severe diverticulosis in the left colon.                           - External and internal hemorrhoids.                           - The examination was otherwise normal on direct                            and retroflexion views.                           - No specimens collected. Recommendation:           - Patient has a contact number available for                            emergencies. The signs and symptoms of potential                             delayed complications were discussed with the                            patient. Return to normal activities tomorrow.                            Written discharge instructions were provided to the                            patient.                           - Resume previous diet.                           - Continue present medications.                           - No repeat colonoscopy due to age and the absence                            of colonic polyps. Gatha Mayer, MD 10/02/2016 11:54:13 AM This report has been signed electronically.

## 2016-10-03 ENCOUNTER — Telehealth: Payer: Self-pay | Admitting: *Deleted

## 2016-10-03 NOTE — Telephone Encounter (Signed)
  Follow up Call-  Call back number 10/02/2016  Post procedure Call Back phone  # [phone]  Permission to leave phone message Yes  Some recent data might be hidden     Patient questions:  Do you have a fever, pain , or abdominal swelling? No. Pain Score  0 *  Have you tolerated food without any problems? Yes.    Have you been able to return to your normal activities? Yes.    Do you have any questions about your discharge instructions: Diet   No. Medications  No. Follow up visit  No.  Do you have questions or concerns about your Care? No.  Actions: * If pain score is 4 or above: No action needed, pain <4.

## 2016-10-30 DIAGNOSIS — R55 Syncope and collapse: Secondary | ICD-10-CM | POA: Diagnosis not present

## 2016-10-30 DIAGNOSIS — I10 Essential (primary) hypertension: Secondary | ICD-10-CM | POA: Diagnosis not present

## 2016-11-22 DIAGNOSIS — I10 Essential (primary) hypertension: Secondary | ICD-10-CM | POA: Diagnosis not present

## 2016-11-22 DIAGNOSIS — R55 Syncope and collapse: Secondary | ICD-10-CM | POA: Diagnosis not present

## 2016-12-06 DIAGNOSIS — I10 Essential (primary) hypertension: Secondary | ICD-10-CM | POA: Diagnosis not present

## 2016-12-06 DIAGNOSIS — R55 Syncope and collapse: Secondary | ICD-10-CM | POA: Diagnosis not present

## 2016-12-26 DIAGNOSIS — I10 Essential (primary) hypertension: Secondary | ICD-10-CM | POA: Diagnosis not present

## 2016-12-26 DIAGNOSIS — L814 Other melanin hyperpigmentation: Secondary | ICD-10-CM | POA: Diagnosis not present

## 2016-12-26 DIAGNOSIS — D485 Neoplasm of uncertain behavior of skin: Secondary | ICD-10-CM | POA: Diagnosis not present

## 2017-01-07 DIAGNOSIS — D485 Neoplasm of uncertain behavior of skin: Secondary | ICD-10-CM | POA: Diagnosis not present

## 2017-01-07 DIAGNOSIS — L821 Other seborrheic keratosis: Secondary | ICD-10-CM | POA: Diagnosis not present

## 2017-01-09 DIAGNOSIS — I10 Essential (primary) hypertension: Secondary | ICD-10-CM | POA: Diagnosis not present

## 2017-01-09 DIAGNOSIS — L814 Other melanin hyperpigmentation: Secondary | ICD-10-CM | POA: Diagnosis not present

## 2017-01-09 DIAGNOSIS — L821 Other seborrheic keratosis: Secondary | ICD-10-CM | POA: Diagnosis not present

## 2017-01-10 DIAGNOSIS — R55 Syncope and collapse: Secondary | ICD-10-CM | POA: Diagnosis not present

## 2017-01-10 DIAGNOSIS — I10 Essential (primary) hypertension: Secondary | ICD-10-CM | POA: Diagnosis not present

## 2017-01-30 ENCOUNTER — Ambulatory Visit (INDEPENDENT_AMBULATORY_CARE_PROVIDER_SITE_OTHER): Payer: Medicare HMO | Admitting: Urgent Care

## 2017-01-30 VITALS — BP 124/78 | HR 92 | Temp 98.5°F | Resp 17 | Ht 62.5 in | Wt 140.0 lb

## 2017-01-30 DIAGNOSIS — L500 Allergic urticaria: Secondary | ICD-10-CM

## 2017-01-30 DIAGNOSIS — R21 Rash and other nonspecific skin eruption: Secondary | ICD-10-CM

## 2017-01-30 NOTE — Progress Notes (Signed)
    MRN: [id number] DOB: Oct 09, 1946  Subjective:   Alexa Hansen is a 71 y.o. female presenting for chief complaint of Rash  Reports 1 day history of "burning" rash over her torso and is now spreading to limbs and back. Rash is not painful, does not itch, states that she "just knows it is there". Has not tried new foods, taken new medications including recent antibiotic use. No time spent outdoors, insect bites, changed hygiene products. Denies fever, cough, sore throat, facial swelling, tongue swelling, chest pain, chest tightness, n/v, abdominal pain.   Alexa Hansen has a current medication list which includes the following prescription(s): alendronate, biotin, calcium citrate-vitamin d, and lisinopril. Also has No Known Allergies.  Alexa Hansen  has a past medical history of Breast cancer ([place]) and Hypertension. Also  has a past surgical history that includes Tonsillectomy; Breast surgery; Mouth surgery; and Vein ligation.  Objective:   Vitals: BP 124/78 (BP Location: Right Arm, Patient Position: Sitting, Cuff Size: Normal)   Pulse 92   Temp 98.5 F (36.9 C) (Oral)   Resp 17   Ht 5' 2.5" (1.588 m)   Wt 140 lb (63.5 kg)   SpO2 100%   BMI 25.20 kg/m   Physical Exam  Constitutional: She is oriented to person, place, and time. She appears well-developed and well-nourished.  HENT:  Mouth/Throat: Oropharynx is clear and moist.  Eyes: Right eye exhibits no discharge. Left eye exhibits no discharge.  Neck: Normal range of motion. Neck supple.  Cardiovascular: Normal rate, regular rhythm and intact distal pulses.  Exam reveals no gallop and no friction rub.   No murmur heard. Pulmonary/Chest: No respiratory distress. She has no wheezes. She has no rales.  Abdominal: Soft. Bowel sounds are normal. She exhibits no distension and no mass. There is no tenderness. There is no guarding.  Lymphadenopathy:    She has no cervical adenopathy.  Neurological: She is alert and oriented to person, place, and  time.  Skin: Skin is warm and dry. Rash (large patches of urticarial lesions spread out over torso and limbs sparing back, face) noted.   Assessment and Plan :   1. Rash and nonspecific skin eruption 2. Allergic urticaria - Start scheduling benadryl. Switch to Zyrtec and Zantac once rash calms down. RTC if other warning symptoms develop as discussed in clinic.   Jaynee Eagles, PA-C [facility] [phone] 01/30/2017  9:50 AM

## 2017-01-30 NOTE — Patient Instructions (Addendum)
Start taking 25-50mg  benadryl (diphenhydramine) every 4-6 hours. Do this until the rash clears up and then switch to Zyrtec (cetirizine) 10mg  once daily with Zantac (ranitidine) 150mg  twice daily for 1 week.    Hives Hives (urticaria) are itchy, red, swollen areas on your skin. Hives can appear on any part of your body and can vary in size. They can be as small as the tip of a pen or much larger. Hives often fade within 24 hours (acute hives). In other cases, new hives appear after old ones fade. This cycle can continue for several days or weeks (chronic hives). Hives result from your body's reaction to an irritant or to something that you are allergic to (trigger). When you are exposed to a trigger, your body releases a chemical (histamine) that causes redness, itching, and swelling. You can get hives immediately after being exposed to a trigger or hours later. Hives do not spread from person to person (are not contagious). Your hives may get worse with scratching, exercise, and emotional stress. What are the causes? Causes of this condition include:  Allergies to certain foods or ingredients.  Insect bites or stings.  Exposure to pollen or pet dander.  Contact with latex or chemicals.  Spending time in sunlight, heat, or cold (exposure).  Exercise.  Stress. You can also get hives from some medical conditions and treatments. These include:  Viruses, including the common cold.  Bacterial infections, such as urinary tract infections and strep throat.  Disorders such as vasculitis, lupus, or thyroid disease.  Certain medications.  Allergy shots.  Blood transfusions. Sometimes, the cause of hives is not known (idiopathic hives). What increases the risk? This condition is more likely to develop in:  Women.  People who have food allergies, especially to citrus fruits, milk, eggs, peanuts, tree nuts, or shellfish.  People who are allergic  to:  Medicines.  Latex.  Insects.  Animals.  Pollen.  People who have certain medical conditions, includinglupus or thyroid disease. What are the signs or symptoms? The main symptom of this condition is raised, itchyred or white bumps or patches on your skin. These areas may:  Become large and swollen (welts).  Change in shape and location, quickly and repeatedly.  Be separate hives or connect over a large area of skin.  Sting or become painful.  Turn white when pressed in the center (blanch). In severe cases, yourhands, feet, and face may also become swollen. This may occur if hives develop deeper in your skin. How is this diagnosed? This condition is diagnosed based on your symptoms, medical history, and physical exam. Your skin, urine, or blood may be tested to find out what is causing your hives and to rule out other health issues. Your health care provider may also remove a small sample of skin from the affected area and examine it under a microscope (biopsy). How is this treated? Treatment depends on the severity of your condition. Your health care provider may recommend using cool, wet cloths (cool compresses) or taking cool showers to relieve itching. Hives are sometimes treated with medicines, including:  Antihistamines.  Corticosteroids.  Antibiotics.  An injectable medicine (omalizumab). Your health care provider may prescribe this if you have chronic idiopathic hives and you continue to have symptoms even after treatment with antihistamines. Severe cases may require an emergency injection of adrenaline (epinephrine) to prevent a life-threatening allergic reaction (anaphylaxis). Follow these instructions at home: Medicines  Take or apply over-the-counter and prescription medicines only as told by  your health care provider.  If you were prescribed an antibiotic medicine, use it as told by your health care provider. Do not stop taking the antibiotic even if you  start to feel better. Skin Care  Apply cool compresses to the affected areas.  Do not scratch or rub your skin. General instructions  Do not take hot showers or baths. This can make itching worse.  Do not wear tight-fitting clothing.  Use sunscreen and wear protective clothing when you are outside.  Avoid any substances that cause your hives. Keep a journal to help you track what causes your hives. Write down:  What medicines you take.  What you eat and drink.  What products you use on your skin.  Keep all follow-up visits as told by your health care provider. This is important. Contact a health care provider if:  Your symptoms are not controlled with medicine.  Your joints are painful or swollen. Get help right away if:  You have a fever.  You have pain in your abdomen.  Your tongue or lips are swollen.  Your eyelids are swollen.  Your chest or throat feels tight.  You have trouble breathing or swallowing. These symptoms may represent a serious problem that is an emergency. Do not wait to see if the symptoms will go away. Get medical help right away. Call your local emergency services (911 in the [place] Do not drive yourself to the hospital.  This information is not intended to replace advice given to you by your health care provider. Make sure you discuss any questions you have with your health care provider. Document Released: 12/03/2005 Document Revised: 05/02/2016 Document Reviewed: 09/21/2015 Elsevier Interactive Patient Education  2017 [employer].     IF you received an x-ray today, you will receive an invoice from [facility]. Please contact [facility] at [phone] with questions or concerns regarding your invoice.   IF you received labwork today, you will receive an invoice from [facility]. Please contact [facility] at [phone] with questions or concerns regarding your invoice.   Our billing staff will not be able to assist you  with questions regarding bills from these companies.  You will be contacted with the lab results as soon as they are available. The fastest way to get your results is to activate your My Chart account. Instructions are located on the last page of this paperwork. If you have not heard from [place] regarding the results in 2 weeks, please contact this office.

## 2017-02-05 DIAGNOSIS — H35373 Puckering of macula, bilateral: Secondary | ICD-10-CM | POA: Diagnosis not present

## 2017-02-14 DIAGNOSIS — R69 Illness, unspecified: Secondary | ICD-10-CM | POA: Diagnosis not present

## 2017-02-15 DIAGNOSIS — M810 Age-related osteoporosis without current pathological fracture: Secondary | ICD-10-CM | POA: Diagnosis not present

## 2017-02-15 DIAGNOSIS — Z1159 Encounter for screening for other viral diseases: Secondary | ICD-10-CM | POA: Diagnosis not present

## 2017-02-15 DIAGNOSIS — Z79899 Other long term (current) drug therapy: Secondary | ICD-10-CM | POA: Diagnosis not present

## 2017-02-20 DIAGNOSIS — R69 Illness, unspecified: Secondary | ICD-10-CM | POA: Diagnosis not present

## 2017-02-21 DIAGNOSIS — Z853 Personal history of malignant neoplasm of breast: Secondary | ICD-10-CM | POA: Diagnosis not present

## 2017-02-21 DIAGNOSIS — I10 Essential (primary) hypertension: Secondary | ICD-10-CM | POA: Diagnosis not present

## 2017-02-21 DIAGNOSIS — Z0000 Encounter for general adult medical examination without abnormal findings: Secondary | ICD-10-CM | POA: Diagnosis not present

## 2017-02-21 DIAGNOSIS — M810 Age-related osteoporosis without current pathological fracture: Secondary | ICD-10-CM | POA: Diagnosis not present

## 2017-04-17 DIAGNOSIS — M810 Age-related osteoporosis without current pathological fracture: Secondary | ICD-10-CM | POA: Diagnosis not present

## 2017-04-17 DIAGNOSIS — Z79899 Other long term (current) drug therapy: Secondary | ICD-10-CM | POA: Diagnosis not present

## 2017-04-17 DIAGNOSIS — Z7983 Long term (current) use of bisphosphonates: Secondary | ICD-10-CM | POA: Diagnosis not present

## 2017-04-17 DIAGNOSIS — Z6825 Body mass index (BMI) 25.0-25.9, adult: Secondary | ICD-10-CM | POA: Diagnosis not present

## 2017-04-17 DIAGNOSIS — Z0000 Encounter for general adult medical examination without abnormal findings: Secondary | ICD-10-CM | POA: Diagnosis not present

## 2017-04-17 DIAGNOSIS — I10 Essential (primary) hypertension: Secondary | ICD-10-CM | POA: Diagnosis not present

## 2017-05-30 DIAGNOSIS — Z1231 Encounter for screening mammogram for malignant neoplasm of breast: Secondary | ICD-10-CM | POA: Diagnosis not present

## 2017-05-30 DIAGNOSIS — Z853 Personal history of malignant neoplasm of breast: Secondary | ICD-10-CM | POA: Diagnosis not present

## 2017-06-04 ENCOUNTER — Emergency Department (HOSPITAL_COMMUNITY): Payer: Medicare HMO

## 2017-06-04 ENCOUNTER — Observation Stay (HOSPITAL_COMMUNITY)
Admission: EM | Admit: 2017-06-04 | Discharge: 2017-06-05 | Disposition: A | Discharge status: Home / Self Care | Payer: Medicare HMO | Attending: Cardiology | Admitting: Cardiology

## 2017-06-04 ENCOUNTER — Encounter (HOSPITAL_COMMUNITY): Payer: Self-pay | Admitting: Nurse Practitioner

## 2017-06-04 DIAGNOSIS — I10 Essential (primary) hypertension: Secondary | ICD-10-CM | POA: Insufficient documentation

## 2017-06-04 DIAGNOSIS — I4892 Unspecified atrial flutter: Principal | ICD-10-CM | POA: Insufficient documentation

## 2017-06-04 DIAGNOSIS — Z79899 Other long term (current) drug therapy: Secondary | ICD-10-CM | POA: Diagnosis not present

## 2017-06-04 DIAGNOSIS — Z853 Personal history of malignant neoplasm of breast: Secondary | ICD-10-CM | POA: Insufficient documentation

## 2017-06-04 DIAGNOSIS — I484 Atypical atrial flutter: Secondary | ICD-10-CM | POA: Diagnosis not present

## 2017-06-04 DIAGNOSIS — R000 Tachycardia, unspecified: Secondary | ICD-10-CM | POA: Diagnosis not present

## 2017-06-04 HISTORY — DX: Unspecified atrial flutter: I48.92

## 2017-06-04 HISTORY — DX: Syncope and collapse: R55

## 2017-06-04 LAB — BASIC METABOLIC PANEL
ANION GAP: 8 (ref 5–15)
BUN: 14 mg/dL (ref 6–20)
CHLORIDE: 105 mmol/L (ref 101–111)
CO2: 23 mmol/L (ref 22–32)
CREATININE: 0.69 mg/dL (ref 0.44–1.00)
Calcium: 9.2 mg/dL (ref 8.9–10.3)
GFR calc non Af Amer: >60 mL/min (ref >60)
GLUCOSE: 101 mg/dL — AB (ref 65–99)
Potassium: 4.5 mmol/L (ref 3.5–5.1)
Sodium: 136 mmol/L (ref 135–145)

## 2017-06-04 LAB — CBC WITH DIFFERENTIAL/PLATELET
Basophils Absolute: 0 10*3/uL (ref 0.0–0.1)
Basophils Relative: 0 %
Eosinophils Absolute: 0.1 10*3/uL (ref 0.0–0.7)
Eosinophils Relative: 1 %
HEMATOCRIT: 34.9 % — AB (ref 36.0–46.0)
HEMOGLOBIN: 11.5 g/dL — AB (ref 12.0–15.0)
LYMPHS ABS: 1.6 10*3/uL (ref 0.7–4.0)
Lymphocytes Relative: 16 %
MCH: 30.7 pg (ref 26.0–34.0)
MCHC: 33 g/dL (ref 30.0–36.0)
MCV: 93.1 fL (ref 78.0–100.0)
MONO ABS: 1 10*3/uL (ref 0.1–1.0)
MONOS PCT: 10 %
NEUTROS ABS: 7.4 10*3/uL (ref 1.7–7.7)
NEUTROS PCT: 73 %
Platelets: 257 10*3/uL (ref 150–400)
RBC: 3.75 MIL/uL — ABNORMAL LOW (ref 3.87–5.11)
RDW: 13.2 % (ref 11.5–15.5)
WBC: 10 10*3/uL (ref 4.0–10.5)

## 2017-06-04 LAB — TSH: TSH: 0.853 u[IU]/mL (ref 0.350–4.500)

## 2017-06-04 LAB — I-STAT TROPONIN, ED: Troponin i, poc: 0.01 ng/mL (ref 0.00–0.08)

## 2017-06-04 LAB — MRSA PCR SCREENING: MRSA BY PCR: NEGATIVE

## 2017-06-04 MED ORDER — SODIUM CHLORIDE 0.9 % IV SOLN: 250.0000 mL | INTRAVENOUS | Status: DC | PRN

## 2017-06-04 MED ORDER — DEXTROSE 5 % IV SOLN
5.0000 mg/h | INTRAVENOUS | Status: DC
  Administered 2017-06-04: 5 mg/h via INTRAVENOUS
  Filled 2017-06-04: qty 100

## 2017-06-04 MED ORDER — ACETAMINOPHEN 325 MG PO TABS
650.0000 mg | ORAL_TABLET | ORAL | Status: DC | PRN
Start: 2017-06-04 — End: 2017-06-05

## 2017-06-04 MED ORDER — METOPROLOL TARTRATE 5 MG/5ML IV SOLN: 5.0000 mg | Freq: Four times a day (QID) | INTRAVENOUS | Status: DC

## 2017-06-04 MED ORDER — DILTIAZEM HCL 100 MG IV SOLR: 5.0000 mg/h | INTRAVENOUS | Status: DC

## 2017-06-04 MED ORDER — SODIUM CHLORIDE 0.9% FLUSH: 3.0000 mL | INTRAVENOUS | Status: DC | PRN

## 2017-06-04 MED ORDER — SODIUM CHLORIDE 0.9% FLUSH
3.0000 mL | Freq: Two times a day (BID) | INTRAVENOUS | Status: DC
  Administered 2017-06-04 – 2017-06-05 (×2): 3 mL via INTRAVENOUS

## 2017-06-04 MED ORDER — ALENDRONATE SODIUM 70 MG PO TABS: 70.0000 mg | ORAL_TABLET | ORAL | Status: DC

## 2017-06-04 MED ORDER — METOPROLOL TARTRATE 5 MG/5ML IV SOLN
5.0000 mg | Freq: Once | INTRAVENOUS | Status: AC
  Administered 2017-06-04: 5 mg via INTRAVENOUS
  Filled 2017-06-04: qty 5

## 2017-06-04 MED ORDER — DILTIAZEM LOAD VIA INFUSION
10.0000 mg | Freq: Once | INTRAVENOUS | Status: AC
  Administered 2017-06-04: 10 mg via INTRAVENOUS
  Filled 2017-06-04: qty 10

## 2017-06-04 MED ORDER — ONDANSETRON HCL 4 MG/2ML IJ SOLN: 4.0000 mg | Freq: Four times a day (QID) | INTRAMUSCULAR | Status: DC | PRN

## 2017-06-04 MED ORDER — APIXABAN 5 MG PO TABS
5.0000 mg | ORAL_TABLET | Freq: Two times a day (BID) | ORAL | Status: DC
  Administered 2017-06-04 – 2017-06-05 (×2): 5 mg via ORAL
  Filled 2017-06-04 (×2): qty 1

## 2017-06-04 NOTE — ED Notes (Signed)
Attempted report 

## 2017-06-04 NOTE — ED Provider Notes (Signed)
[facility] DEPT Provider Note   CSN: [id number] Arrival date & time: 06/04/17  1631     History   Chief Complaint Chief Complaint  Patient presents with  . Tachycardia    HPI Alexa Hansen is a 71 y.o. female.  Alexa Hansen is a 71 y.o. Female who presents to the ED via EMS with tachycardia. Patient reports yesterday she was taking her blood pressure at home for the first time in several weeks and she noted her heart rate to be above 100 which is unusual. She reports checking her blood pressure again today and noticed her heart rate to be 150. She denies feeling any symptoms like palpitations or fast heart rate. She went to a walk in clinic and they sent her to the ER. She denies any history of atrial flutter or atrial fibrillation. She is not on blood thinners. She has seen cardiologist Dr. Einar Gip previously and most recently saw him 6 months ago. She's been compliant with her blood pressure medications. She does report feeling slightly more fatigued over the past 3 weeks. She denies chest pain, shortness of breath, palpitations, lightheadedness, dizziness, syncope, abdominal pain, nausea, vomiting, or fevers.    The history is provided by the patient and medical records. No language interpreter was used.    Past Medical History:  Diagnosis Date  . Breast cancer ([place])   . Hypertension     Patient Active Problem List   Diagnosis Date Noted  . Atrial flutter by electrocardiogram ([place]) 06/04/2017    Past Surgical History:  Procedure Laterality Date  . BREAST SURGERY    . MOUTH SURGERY     to remove torus  . TONSILLECTOMY    . VEIN LIGATION     Dr Deon Pilling    OB History    No data available       Home Medications    Prior to Admission medications   Medication Sig Start Date End Date Taking? Authorizing Provider  alendronate (FOSAMAX) 70 MG tablet Take 70 mg by mouth once a week. MONDAYS 06/02/17  Yes [provider]  BIOTIN PO Take 1 tablet by mouth  daily.    Yes [provider]  Calcium Carbonate-Vitamin D (CALCIUM + D PO) Take 1 tablet by mouth 2 (two) times daily.    Yes [provider]  lisinopril (PRINIVIL,ZESTRIL) 20 MG tablet Take 20 mg by mouth every evening.  06/01/17  Yes [provider]    Family History Family History  Problem Relation Age of Onset  . Colon cancer Neg Hx     Social History Social History  Substance Use Topics  . Smoking status: Never Smoker  . Smokeless tobacco: Never Used  . Alcohol use No     Allergies   Patient has no known allergies.   Review of Systems Review of Systems  Constitutional: Positive for fatigue. Negative for chills and fever.  HENT: Negative for congestion and sore throat.   Eyes: Negative for visual disturbance.  Respiratory: Negative for cough, shortness of breath and wheezing.   Cardiovascular: Negative for chest pain and palpitations.  Gastrointestinal: Negative for abdominal pain, diarrhea, nausea and vomiting.  Genitourinary: Negative for dysuria.  Musculoskeletal: Negative for back pain and neck pain.  Skin: Negative for rash.  Neurological: Negative for dizziness, syncope, weakness, light-headedness and headaches.     Physical Exam Updated Vital Signs BP 97/63   Pulse 76   Resp 16   Ht 5\' 3"  (1.6 m)  Wt 61.7 kg (136 lb)   SpO2 98%   BMI 24.09 kg/m   Physical Exam  Constitutional: She is oriented to person, place, and time. She appears well-developed and well-nourished. No distress.  Nontoxic appearing.  HENT:  Head: Normocephalic and atraumatic.  Mouth/Throat: Oropharynx is clear and moist.  Eyes: Conjunctivae are normal. Pupils are equal, round, and reactive to light. Right eye exhibits no discharge. Left eye exhibits no discharge.  Neck: Normal range of motion. Neck supple. No JVD present. No tracheal deviation present.  Cardiovascular: Regular rhythm, normal heart sounds and intact distal pulses.  Exam reveals no gallop  and no friction rub.   No murmur heard. Heart rate of 150 on exam. Regular rhythm. A-flutter on the monitor.   Pulmonary/Chest: Effort normal and breath sounds normal. No stridor. No respiratory distress. She has no wheezes. She has no rales.  Lungs are clear to ascultation bilaterally. Symmetric chest expansion bilaterally. No increased work of breathing. No rales or rhonchi.    Abdominal: Soft. There is no tenderness.  Musculoskeletal: She exhibits no edema or tenderness.  No LE edema or TTP.   Lymphadenopathy:    She has no cervical adenopathy.  Neurological: She is alert and oriented to person, place, and time. No sensory deficit. She exhibits normal muscle tone. Coordination normal.  Skin: Skin is warm and dry. Capillary refill takes less than 2 seconds. No rash noted. She is not diaphoretic. No erythema. No pallor.  Psychiatric: She has a normal mood and affect. Her behavior is normal.  Nursing note and vitals reviewed.    ED Treatments / Results  Labs (all labs ordered are listed, but only abnormal results are displayed) Labs Reviewed  BASIC METABOLIC PANEL - Abnormal; Notable for the following:       Result Value   Glucose, Bld 101 (*)    All other components within normal limits  CBC WITH DIFFERENTIAL/PLATELET - Abnormal; Notable for the following:    RBC 3.75 (*)    Hemoglobin 11.5 (*)    HCT 34.9 (*)    All other components within normal limits  TSH  I-STAT TROPOININ, ED    EKG  EKG Interpretation  Date/Time:  Tuesday June 04 2017 16:42:31 EDT Ventricular Rate:  149 PR Interval:    QRS Duration: 166 QT Interval:  373 QTC Calculation: 588 R Axis:   -34 Text Interpretation:  A-flutter  IVCD, consider atypical RBBB Left ventricular hypertrophy Abnormal T, consider ischemia, diffuse leads No STEMI.  Confirmed by Nanda Quinton [phone]) on 06/04/2017 4:52:33 PM       Radiology Dg Chest Port 1 View  Result Date: 06/04/2017 CLINICAL DATA:  Two days of tachycardia.  EXAM: PORTABLE CHEST 1 VIEW COMPARISON:  Chest radiograph February 21, 2010 FINDINGS: Cardiomediastinal silhouette is normal. No pleural effusion or focal consolidation. No pneumothorax. Osteopenia. Broad thoracic levoscoliosis. IMPRESSION: No acute cardiopulmonary process. Electronically Signed   By: Elon Alas M.D.   On: 06/04/2017 17:28    Procedures Procedures (including critical care time)  CRITICAL CARE Performed by: Hanley Hays   Total critical care time: 45 minutes  Critical care time was exclusive of separately billable procedures and treating other patients.  Critical care was necessary to treat or prevent imminent or life-threatening deterioration.  Critical care was time spent personally by me on the following activities: development of treatment plan with patient and/or surrogate as well as nursing, discussions with consultants, evaluation of patient's response to treatment, examination of patient, obtaining history  from patient or surrogate, ordering and performing treatments and interventions, ordering and review of laboratory studies, ordering and review of radiographic studies, pulse oximetry and re-evaluation of patient's condition.   Medications Ordered in ED Medications  diltiazem (CARDIZEM) 1 mg/mL load via infusion 10 mg (10 mg Intravenous Bolus from Bag 06/04/17 1715)    And  diltiazem (CARDIZEM) 100 mg in dextrose 5 % 100 mL (1 mg/mL) infusion (10 mg/hr Intravenous Rate/Dose Change 06/04/17 1809)  alendronate (FOSAMAX) tablet 70 mg (not administered)  acetaminophen (TYLENOL) tablet 650 mg (not administered)  ondansetron (ZOFRAN) injection 4 mg (not administered)  sodium chloride flush (NS) 0.9 % injection 3 mL (not administered)  sodium chloride flush (NS) 0.9 % injection 3 mL (not administered)  0.9 %  sodium chloride infusion (not administered)  apixaban (ELIQUIS) tablet 5 mg (not administered)  metoprolol tartrate (LOPRESSOR) injection 5 mg (5 mg  Intravenous Not Given 06/04/17 1906)  diltiazem (CARDIZEM) 100 mg in dextrose 5 % 100 mL (1 mg/mL) infusion (0 mg/hr Intravenous Hold 06/04/17 1907)  metoprolol tartrate (LOPRESSOR) injection 5 mg (5 mg Intravenous Given 06/04/17 1859)     Initial Impression / Assessment and Plan / ED Course  I have reviewed the triage vital signs and the nursing notes.  Pertinent labs & imaging results that were available during my care of the patient were reviewed by me and considered in my medical decision making (see chart for details).    This  is a 71 y.o. Female who presents to the ED via EMS with tachycardia. Patient reports yesterday she was taking her blood pressure at home for the first time in several weeks and she noted her heart rate to be above 100 which is unusual. She reports checking her blood pressure again today and noticed her heart rate to be 150. She denies feeling any symptoms like palpitations or fast heart rate. She went to a walk in clinic and they sent her to the ER. She denies any history of atrial flutter or atrial fibrillation. She is not on blood thinners. She has seen cardiologist Dr. Einar Gip previously and most recently saw him 6 months ago. She's been compliant with her blood pressure medications. She does report feeling slightly more fatigued over the past 3 weeks. She denies chest pain, palpitations, or syncope.  On exam the patient is afebrile nontoxic appearing. Her HR is 152 on the monitor. A flutter on the monitor. She is good pulses. Blood pressure is 122/78. She is mentating well. She has no complaints.   EKG shows A flutter.  Will start cardizem with a 10 mg bolus and titrate. Patient agrees.   Troponin is not elevated. B MP is unremarkable. CBC is unremarkable. Chest x-ray is unremarkable.  At reevaluation patient's heart rate is still in the 150s. Blood pressure still stable. She has no complaints. Will consult with her cardiologist Dr. Einar Gip.   I consulted with  cardiologist Dr. Einar Gip reports he'll be by to see the patient. Plan for admission.  Dr. Einar Gip down to see the patient. Plans for admission. Patient still doing well on cardizem.   This patient was discussed with and evaluated by Dr. Laverta Baltimore who agrees with assessment and plan.   Final Clinical Impressions(s) / ED Diagnoses   Final diagnoses:  Atrial flutter by electrocardiogram [facility])    New Prescriptions New Prescriptions   No medications on file     Waynetta Pean, Hershal Coria 06/04/17 2021    Margette Fast, MD 06/05/17 [phone]

## 2017-06-04 NOTE — H&P (Addendum)
Alexa Hansen is an 71 y.o. female.   Chief Complaint: Elevated heart rate. HPI: Alexa Hansen  is a 71 y.o. female. She was last seen by me in January 2018 for evaluation of syncope,  2 episodes of syncope, both in March and in November 2017, patient initially was in the shower taking a hot shower, felt dizzy and him out of the shower, had to have a bowel movement and when she stood up, she had an episode of syncope.  Second episode occurred in the morning when she woke up, was making coffee when she felt slightly dizzy and also wanted to go to the bathroom at the same time.  After she had a bowel movement, was she was walking back felt dizzy again and thought that she should go later.  While trying to get back to her bedroom she had an episode of syncope. No further episodes.  At that time she had an echocardiogram which revealed normal LV systolic function and mild diastolic dysfunction.  No stress testing was performed as patient was very active and denied any symptoms of chest pain or shortness of breath.  She is now admitted to the hospital with new onset of atrial flutter with rapid ventricular response.  She noticed that her heart rate had been elevated when she was checking the blood pressure yesterday and also today and called health advisor by telephone through her insurance, she made an appointment to see her PCP and was noted to be atrial flutter with RVR and hence was recommended to go to the emergency room via EMS.  Essentially asymptomatic and denies any chest pain, shortness breath, palpitations, dizziness or syncope. Husband present at bedside. She even went to her routine exercise yesterday.   Past Medical History:  Diagnosis Date  . Breast cancer ([place])   . Hypertension     Past Surgical History:  Procedure Laterality Date  . BREAST SURGERY    . MOUTH SURGERY     to remove torus  . TONSILLECTOMY    . VEIN LIGATION     Dr Deon Pilling    Family History  Problem Relation Age of  Onset  . Colon cancer Neg Hx    Social History:  reports that she has never smoked. She has never used smokeless tobacco. She reports that she does not drink alcohol or use drugs.  Allergies: No Known Allergies  Review of Systems - Negative except for elevated heart rate    Blood pressure (!) 118/98, pulse (!) 150, resp. rate 16, height '5\' 3"'  (1.6 m), weight 61.7 kg (136 lb), SpO2 99 %. Body mass index is 24.09 kg/m.  General appearance: alert, cooperative, appears stated age and no distress Eyes: negative findings: lids and lashes normal Neck: no adenopathy, no carotid bruit, no JVD, supple, symmetrical, trachea midline and thyroid not enlarged, symmetric, no tenderness/mass/nodules Neck: JVP - normal, carotids 2+= without bruits Resp: clear to auscultation bilaterally Chest wall: no tenderness Cardio: regular rate and rhythm and tachycardic, no rub or murmur. GI: soft, non-tender; bowel sounds normal; no masses,  no organomegaly Extremities: extremities normal, atraumatic, no cyanosis or edema Pulses: 2+ and symmetric Skin: Skin color, texture, turgor normal. No rashes or lesions Neurologic: Grossly normal  Results for orders placed or performed during the hospital encounter of 06/04/17 (from the past 48 hour(s))  Basic metabolic panel     Status: Abnormal   Collection Time: 06/04/17  4:56 PM  Result Value Ref Range   Sodium 136  135 - 145 mmol/L   Potassium 4.5 3.5 - 5.1 mmol/L   Chloride 105 101 - 111 mmol/L   CO2 23 22 - 32 mmol/L   Glucose, Bld 101 (H) 65 - 99 mg/dL   BUN 14 6 - 20 mg/dL   Creatinine, Ser 0.69 0.44 - 1.00 mg/dL   Calcium 9.2 8.9 - 10.3 mg/dL   GFR calc non Af Amer >60 >60 mL/min   GFR calc Af Amer >60 >60 mL/min    Comment: (NOTE) The eGFR has been calculated using the CKD EPI equation. This calculation has not been validated in all clinical situations. eGFR's persistently <60 mL/min signify possible Chronic Kidney Disease.    Anion gap 8 5 - 15   CBC with Differential     Status: Abnormal   Collection Time: 06/04/17  4:56 PM  Result Value Ref Range   WBC 10.0 4.0 - 10.5 K/uL   RBC 3.75 (L) 3.87 - 5.11 MIL/uL   Hemoglobin 11.5 (L) 12.0 - 15.0 g/dL   HCT 34.9 (L) 36.0 - 46.0 %   MCV 93.1 78.0 - 100.0 fL   MCH 30.7 26.0 - 34.0 pg   MCHC 33.0 30.0 - 36.0 g/dL   RDW 13.2 11.5 - 15.5 %   Platelets 257 150 - 400 K/uL   Neutrophils Relative % 73 %   Neutro Abs 7.4 1.7 - 7.7 K/uL   Lymphocytes Relative 16 %   Lymphs Abs 1.6 0.7 - 4.0 K/uL   Monocytes Relative 10 %   Monocytes Absolute 1.0 0.1 - 1.0 K/uL   Eosinophils Relative 1 %   Eosinophils Absolute 0.1 0.0 - 0.7 K/uL   Basophils Relative 0 %   Basophils Absolute 0.0 0.0 - 0.1 K/uL  I-stat troponin, ED     Status: None   Collection Time: 06/04/17  5:00 PM  Result Value Ref Range   Troponin i, poc 0.01 0.00 - 0.08 ng/mL   Comment 3            Comment: Due to the release kinetics of cTnI, a negative result within the first hours of the onset of symptoms does not rule out myocardial infarction with certainty. If myocardial infarction is still suspected, repeat the test at appropriate intervals.     Labs:   Lab Results  Component Value Date   WBC 10.0 06/04/2017   HGB 11.5 (L) 06/04/2017   HCT 34.9 (L) 06/04/2017   MCV 93.1 06/04/2017   PLT 257 06/04/2017    Recent Labs Lab 06/04/17 1656  NA 136  K 4.5  CL 105  CO2 23  BUN 14  CREATININE 0.69  CALCIUM 9.2  GLUCOSE 101*    Current Facility-Administered Medications:  .  [COMPLETED] diltiazem (CARDIZEM) 1 mg/mL load via infusion 10 mg, 10 mg, Intravenous, Once, 10 mg at 06/04/17 1715 **AND** diltiazem (CARDIZEM) 100 mg in dextrose 5 % 100 mL (1 mg/mL) infusion, 5-15 mg/hr, Intravenous, Continuous, Dansie, William, PA-C, Last Rate: 10 mL/hr at 06/04/17 1809, 10 mg/hr at 06/04/17 1809  Current Outpatient Prescriptions:  .  alendronate (FOSAMAX) 70 MG tablet, Take 70 mg by mouth once a week. MONDAYS, Disp: ,  Rfl: 2 .  BIOTIN PO, Take 1 tablet by mouth daily. , Disp: , Rfl:  .  Calcium Carbonate-Vitamin D (CALCIUM + D PO), Take 1 tablet by mouth 2 (two) times daily. , Disp: , Rfl:  .  lisinopril (PRINIVIL,ZESTRIL) 20 MG tablet, Take 20 mg by mouth every evening. ,  Disp: , Rfl: 2  CARDIAC STUDIES:  EKG 06/03/2017: Typical atrial flutter with 2:1 AV conduction at the rate of 150 bpm.  No ischemic ST-T abnormality, nonspecific T abnormality evident normal QT interval  Echocardiogram 12/06/2016: Left ventricle cavity is normal in size. Mild concentric hypertrophy of the left ventricle. Normal global wall motion. Doppler evidence of grade I (impaired) diastolic dysfunction, normal LAP. Calculated EF 67%. Left atrial cavity is moderately dilated AP diameter 4.3 cm, but appears larger. Mild tricuspid regurgitation. No evidence of pulmonary hypertension.  Assessment/Plan 1. New onset atrial flutter with 2:1 AV conduction, ventricular rate 150 beats a minute. CHA2DS2-VASCScore: Risk Score  3,  Yearly risk of stroke  3.2. Recommendation: ASA No/Anticoagulation Yes  2. Hypertension  Recommendation: Patient completely asymptomatic and discovered elevated heart rate when checking her blood pressure yesterday and also today. Episode may have been ongoing for longer than 48 hours. I will start her on Eliquis 5 mg by mouth twice a day, also add metoprolol IV for rate control and consider TEE guided direct current cardioversion if she does not control her heart rate. Otherwise anticoagulate for 2 weeks and consider cardioversion and also will refer her to be evaluated by EP as atrial flutter appears to be typical and she may not need long-term anticoagulation. I have discussed these issues with the patient and her husband at the bedside. In spite of elevated heart rate, I do not see any ST-T wave changes of ischemia. However I could certainly arrange for stress test at a later date. I will check her TSH.  I have  also discussed TEE/Cardioversion with the patient and also the risk of aspiration pneumonia, esophageal perforation. Risk of bleeding with Eliquis discussed.   Adrian Prows, MD 06/04/2017, 6:30 PM [facility] [place] Pager: [phone] Office: [phone] If no answer: Cell:  [phone]

## 2017-06-04 NOTE — ED Triage Notes (Signed)
Per EMS pt from [facility] at PCP for atrial flutter at a rate of 150's. Patient noted her HR was elevated yesterday when she was checking her BP and HR and made PCP appointment. Patient denies chest pain, palpitations, dizziness, lightheadedness. Patient noted to be in atrial fibrillation with rapid rate at PCP with no history of such.

## 2017-06-05 ENCOUNTER — Encounter (HOSPITAL_COMMUNITY): Payer: Self-pay | Admitting: Nurse Practitioner

## 2017-06-05 DIAGNOSIS — I4892 Unspecified atrial flutter: Secondary | ICD-10-CM | POA: Diagnosis not present

## 2017-06-05 DIAGNOSIS — I484 Atypical atrial flutter: Secondary | ICD-10-CM | POA: Diagnosis not present

## 2017-06-05 DIAGNOSIS — Z853 Personal history of malignant neoplasm of breast: Secondary | ICD-10-CM | POA: Diagnosis not present

## 2017-06-05 DIAGNOSIS — Z79899 Other long term (current) drug therapy: Secondary | ICD-10-CM | POA: Diagnosis not present

## 2017-06-05 DIAGNOSIS — I10 Essential (primary) hypertension: Secondary | ICD-10-CM | POA: Diagnosis not present

## 2017-06-05 MED ORDER — OFF THE BEAT BOOK
Freq: Once | Status: AC
  Administered 2017-06-05: 08:00:00
  Filled 2017-06-05: qty 1

## 2017-06-05 MED ORDER — METOPROLOL TARTRATE 50 MG PO TABS: 50.0000 mg | ORAL_TABLET | Freq: Two times a day (BID) | ORAL | 1 refills | Status: DC

## 2017-06-05 MED ORDER — METOPROLOL TARTRATE 50 MG PO TABS
50.0000 mg | ORAL_TABLET | Freq: Two times a day (BID) | ORAL | Status: DC
  Administered 2017-06-05: 50 mg via ORAL
  Filled 2017-06-05: qty 1

## 2017-06-05 MED ORDER — APIXABAN 5 MG PO TABS: 5.0000 mg | ORAL_TABLET | Freq: Two times a day (BID) | ORAL | 3 refills | Status: DC

## 2017-06-05 NOTE — Consult Note (Signed)
ELECTROPHYSIOLOGY CONSULT NOTE    Patient ID: Alexa Hansen MRN: [id number], DOB/AGE: [birth year] 71 y.o.  Admit date: 06/04/2017 Date of Consult: 06/05/2017  Primary Physician: Leighton Ruff, MD Primary Cardiologist: Einar Gip  Reason for Consultation: atrial flutter  HPI:  Alexa Hansen is a 71 y.o. female is referred by Dr Einar Gip for evaluation of atrial flutter.  Her past medical history is notable for hypertension. She went to her PCP's office on the day of admission for evaluation of tachycardia which was relatively asymptomatic. She was found to be in new onset atrial flutter and transferred to [facility] for further evaluation.  She has converted on IV diltiazem. EP has been asked to evaluate for consideration of ablation.  While she did not have typical symptoms of atrial flutter, she has noticed intermittent fatigue and exercise intolerance over the last few weeks. These episodes do not have clear triggers and terminate spontaneously. She is not able to qualify them more precisely.    She has also had syncope in the past that has been felt to be vasovagal. Echo done at Dr Irven Shelling office showed normal LV function without significant valvular abnormalities or LA enlargement.    She currently feels well without chest pain, shortness of breath, LE edema, recent nausea, vomiting, fevers, or chills.   Past Medical History:  Diagnosis Date  . Atrial flutter ([place])   . Breast cancer ([place])   . Hypertension   . Syncope      Surgical History:  Past Surgical History:  Procedure Laterality Date  . BREAST SURGERY    . MOUTH SURGERY     to remove torus  . TONSILLECTOMY    . VEIN LIGATION     Dr Deon Pilling     Prescriptions Prior to Admission  Medication Sig Dispense Refill Last Dose  . alendronate (FOSAMAX) 70 MG tablet Take 70 mg by mouth once a week. MONDAYS  2 06/03/2017 at am  . BIOTIN PO Take 1 tablet by mouth daily.    06/04/2017 at am  . Calcium Carbonate-Vitamin D (CALCIUM + D PO)  Take 1 tablet by mouth 2 (two) times daily.    06/04/2017 at am  . lisinopril (PRINIVIL,ZESTRIL) 20 MG tablet Take 20 mg by mouth every evening.   2 06/03/2017 at pm    Inpatient Medications:  . apixaban  5 mg Oral BID  . metoprolol tartrate  50 mg Oral BID  . sodium chloride flush  3 mL Intravenous Q12H    Allergies: No Known Allergies  Social History   Social History  . Marital status: Married    Spouse name: N/A  . Number of children: N/A  . Years of education: N/A   Occupational History  . Not on file.   Social History Main Topics  . Smoking status: Never Smoker  . Smokeless tobacco: Never Used  . Alcohol use No  . Drug use: No  . Sexual activity: No   Other Topics Concern  . Not on file   Social History Narrative  . No narrative on file     Family History  Problem Relation Age of Onset  . Colon cancer Neg Hx      Review of Systems: All other systems reviewed and are otherwise negative except as noted above.  Physical Exam: Vitals:   06/05/17 0100 06/05/17 0401 06/05/17 0735 06/05/17 0800  BP: 103/64 109/68 (!) 89/64 (!) 97/58  Pulse: (!) 57 70 78 (!) 49  Resp: 13 16 18  10  Temp:  98.3 F (36.8 C) 98 F (36.7 C)   TempSrc:  Oral Oral   SpO2: 100% 98% 100% 99%  Weight:  132 lb 7.9 oz (60.1 kg)    Height:        GEN- The patient is well appearing, alert and oriented x 3 today.   HEENT: normocephalic, atraumatic; sclera clear, conjunctiva pink; hearing intact; oropharynx clear; neck supple  Lungs- Clear to ausculation bilaterally, normal work of breathing.  No wheezes, rales, rhonchi Heart- Regular rate and rhythm, frequent ectopy GI- soft, non-tender, non-distended, bowel sounds present Extremities- no clubbing, cyanosis, or edema; DP/PT/radial pulses 2+ bilaterally MS- no significant deformity or atrophy Skin- warm and dry, no rash or lesion Psych- euthymic mood, full affect Neuro- strength and sensation are intact  Labs:   Lab Results    Component Value Date   WBC 10.0 06/04/2017   HGB 11.5 (L) 06/04/2017   HCT 34.9 (L) 06/04/2017   MCV 93.1 06/04/2017   PLT 257 06/04/2017     Recent Labs Lab 06/04/17 1656  NA 136  K 4.5  CL 105  CO2 23  BUN 14  CREATININE 0.69  CALCIUM 9.2  GLUCOSE 101*      Radiology/Studies: Dg Chest Port 1 View  Result Date: 06/04/2017 CLINICAL DATA:  Two days of tachycardia. EXAM: PORTABLE CHEST 1 VIEW COMPARISON:  Chest radiograph February 21, 2010 FINDINGS: Cardiomediastinal silhouette is normal. No pleural effusion or focal consolidation. No pneumothorax. Osteopenia. Broad thoracic levoscoliosis. IMPRESSION: No acute cardiopulmonary process. Electronically Signed   By: Elon Alas M.D.   On: 06/04/2017 17:28    [id number] atrial flutter, V rate 149  (personally reviewed)  TELEMETRY: atrial flutter with conversion to SR without post termination pause (personally reviewed)   Assessment/Plan: 1.  Typical atrial flutter We spent a significant amount of time discussing pathophysiology as well as treatment options. Risks, benefits to ablation reviewed. She has had recurrent atrial flutter this afternoon, but remains asymptomatic.  She would like to proceed with ablation.  Will plan as an outpatient. Dr Lovena Le has also reviewed with patient.  Continue Eliquis until after ablation for [facility] of 3 Flutter ablation scheduled for next Wednesday 06/12/17 with Dr Lovena Le. Instructions entered in AVS  2.  HTN Stable No change required today  Ok from our standpoint to discharge home.   Signed, Chanetta Marshall, NP 06/05/2017 11:11 AM  EP Attending  Patient seen and examined. Agree with above. The patient is a pleasant woman with minimally symptomatic atrial flutter with a RVR who presented and was treated with IV cardizem with subsequent re-initiation of atrial flutter. She has been placed on anti-coagulation. On exam she is a pleasant woman with clear lungs and a regular tachycardia and  no edema. Abdomen is soft and nontender. Neuro is normal ECG reveals probable typical atrial flutter.  A/P 1. Atrial flutter with a RVR - I have discussed the treatment options with the patient and the risks/benefits/goals/expectations of EP study and ablation were reviewed and she will return next week to undergo ablation.  2. coags - she has been started on systemic anti-coagulation.  3. HTN - she appears stable on medical therapy.  Mikle Bosworth.D.

## 2017-06-05 NOTE — Progress Notes (Signed)
Patient has received all discharge information and education. It was also emphasized how important it is not to miss a dose of Eliquis and to take it the morning of her ablation. She has verbalized understanding.

## 2017-06-05 NOTE — Discharge Instructions (Addendum)
Ablation scheduled for Wednesday, 06/12/17 with Dr Corliss Parish to have breakfast morning of procedure Nothing to eat or drink after 9AM Arrive to short stay at 1PM day of procedure Plan to spend one night in the hospital You will need someone to drive home Do not miss any doses of Eliquis between now and procedure.  Cardiac Ablation Cardiac ablation is a procedure to disable (ablate) a small amount of heart tissue in very specific places. The heart has many electrical connections. Sometimes these connections are abnormal and can cause the heart to beat very fast or irregularly. Ablating some of the problem areas can improve the heart rhythm or return it to normal. Ablation may be done for people who:  Have Wolff-Parkinson-White syndrome.  Have fast heart rhythms (tachycardia).  Have taken medicines for an abnormal heart rhythm (arrhythmia) that were not effective or caused side effects.  Have a high-risk heartbeat that may be life-threatening.  During the procedure, a small incision is made in the neck or the groin, and a long, thin, flexible tube (catheter) is inserted into the incision and moved to the heart. Small devices (electrodes) on the tip of the catheter will send out electrical currents. A type of X-ray (fluoroscopy) will be used to help guide the catheter and to provide images of the heart. Tell a health care provider about:  Any allergies you have.  All medicines you are taking, including vitamins, herbs, eye drops, creams, and over-the-counter medicines.  Any problems you or family members have had with anesthetic medicines.  Any blood disorders you have.  Any surgeries you have had.  Any medical conditions you have, such as kidney failure.  Whether you are pregnant or may be pregnant. What are the risks? Generally, this is a safe procedure. However, problems may occur, including:  Infection.  Bruising and bleeding at the catheter insertion site.  Bleeding into  the chest, especially into the sac that surrounds the heart. This is a serious complication.  Stroke or blood clots.  Damage to other structures or organs.  Allergic reaction to medicines or dyes.  Need for a permanent pacemaker if the normal electrical system is damaged. A pacemaker is a small computer that sends electrical signals to the heart and helps your heart beat normally.  The procedure not being fully effective. This may not be recognized until months later. Repeat ablation procedures are sometimes required.  What happens before the procedure?  Follow instructions from your health care provider about eating or drinking restrictions.  Ask your health care provider about: ? Changing or stopping your regular medicines. This is especially important if you are taking diabetes medicines or blood thinners. ? Taking medicines such as aspirin and ibuprofen. These medicines can thin your blood. Do not take these medicines before your procedure if your health care provider instructs you not to.  Plan to have someone take you home from the hospital or clinic.  If you will be going home right after the procedure, plan to have someone with you for 24 hours. What happens during the procedure?  To lower your risk of infection: ? Your health care team will wash or sanitize their hands. ? Your skin will be washed with soap. ? Hair may be removed from the incision area.  An IV tube will be inserted into one of your veins.  You will be given a medicine to help you relax (sedative).  The skin on your neck or groin will be numbed.  An incision  will be made in your neck or your groin.  A needle will be inserted through the incision and into a large vein in your neck or groin.  A catheter will be inserted into the needle and moved to your heart.  Dye may be injected through the catheter to help your surgeon see the area of the heart that needs treatment.  Electrical currents will be  sent from the catheter to ablate heart tissue in desired areas. There are three types of energy that may be used to ablate heart tissue: ? Heat (radiofrequency energy). ? Laser energy. ? Extreme cold (cryoablation).  When the necessary tissue has been ablated, the catheter will be removed.  Pressure will be held on the catheter insertion area to prevent excessive bleeding.  A bandage (dressing) will be placed over the catheter insertion area. The procedure may vary among health care providers and hospitals. What happens after the procedure?  Your blood pressure, heart rate, breathing rate, and blood oxygen level will be monitored until the medicines you were given have worn off.  Your catheter insertion area will be monitored for bleeding. You will need to lie still for a few hours to ensure that you do not bleed from the catheter insertion area.  Do not drive for 24 hours or as long as directed by your health care provider. Summary  Cardiac ablation is a procedure to disable (ablate) a small amount of heart tissue in very specific places. Ablating some of the problem areas can improve the heart rhythm or return it to normal.  During the procedure, electrical currents will be sent from the catheter to ablate heart tissue in desired areas. This information is not intended to replace advice given to you by your health care provider. Make sure you discuss any questions you have with your health care provider. Document Released: 04/21/2009 Document Revised: 10/22/2016 Document Reviewed: 10/22/2016 Elsevier Interactive Patient Education  2018 [place] on my medicine - ELIQUIS (apixaban)  Why was Eliquis prescribed for you? Eliquis was prescribed for you to reduce the risk of a blood clot forming that can cause a stroke if you have a medical condition called atrial fibrillation (a type of irregular heartbeat).  What do You need to know about Eliquis ? Take your  Eliquis TWICE DAILY - one tablet in the morning and one tablet in the evening with or without food. If you have difficulty swallowing the tablet whole please discuss with your pharmacist how to take the medication safely.  Take Eliquis exactly as prescribed by your doctor and DO NOT stop taking Eliquis without talking to the doctor who prescribed the medication.  Stopping may increase your risk of developing a stroke.  Refill your prescription before you run out.  After discharge, you should have regular check-up appointments with your healthcare provider that is prescribing your Eliquis.  In the future your dose may need to be changed if your kidney function or weight changes by a significant amount or as you get older.  What do you do if you miss a dose? If you miss a dose, take it as soon as you remember on the same day and resume taking twice daily.  Do not take more than one dose of ELIQUIS at the same time to make up a missed dose.  Important Safety Information A possible side effect of Eliquis is bleeding. You should call your healthcare provider right away if you experience any of the following: ? Bleeding  from an injury or your nose that does not stop. ? Unusual colored urine (red or dark brown) or unusual colored stools (red or black). ? Unusual bruising for unknown reasons. ? A serious fall or if you hit your head (even if there is no bleeding).  Some medicines may interact with Eliquis and might increase your risk of bleeding or clotting while on Eliquis. To help avoid this, consult your healthcare provider or pharmacist prior to using any new prescription or non-prescription medications, including herbals, vitamins, non-steroidal anti-inflammatory drugs (NSAIDs) and supplements.  This website has more information on Eliquis (apixaban): http://[email or web address].com/eliquis/home

## 2017-06-05 NOTE — Discharge Summary (Signed)
Physician Discharge Summary  Patient ID: Alexa Hansen MRN: [id number] DOB/AGE: 01/04/1946 71 y.o.  Admit date: 06/04/2017 Discharge date: 06/05/2017  Primary Discharge Diagnosis 1. New onset atrial flutter with 2:1 AV conduction, ventricular rate 150 beats a minute. CHA2DS2-VASCScore: Risk Score  3,  Yearly risk of stroke  3.2. Recommendation: ASA No/Anticoagulation Yes  2. Hypertension   Significant Diagnostic Studies: None  Hospital Course: Patient admitted via emergency room when she noticed her heart rate had been elevated by checking a blood pressure but otherwise asymptomatic.  She did spontaneously convert to sinus rhythm with IV diltiazem and also IV metoprolol, however prior to discharge she again developed recurrent atrial flutter but was evaluated by EP, Dr. Crissie Sickles and felt safe for discharge.  She has no other cardiovascular risk factors, she exercises on a regular basis fairly vigorously.  In spite of heart rate of 150 bpm, there was no ST-T wave changes of ischemia   Recommendations on discharge: Patient was scheduled for atrial flutter ablation by Dr. Crissie Sickles on 06/12/2017.  She was discharged home on Eliquis5 mg p.o. B.i.d., metoprolol 50 mg p.o. b.i.d. and lisinopril was discontinued.   Discharge Exam: Blood pressure 129/83, pulse (!) 137, temperature 98.6 F (37 C), temperature source Oral, resp. rate 19, height 5\' 3"  (1.6 m), weight 60.1 kg (132 lb 7.9 oz), SpO2 100 %.   General appearance: alert, cooperative, appears stated age and no distress Resp: clear to auscultation bilaterally Cardio: regular rate and rhythm, S1, S2 normal, no murmur, click, rub or gallop GI: soft, non-tender; bowel sounds normal; no masses,  no organomegaly Extremities: extremities normal, atraumatic, no cyanosis or edema Pulses: 2+ and symmetric Neurologic: Grossly normal Labs:   Lab Results  Component Value Date   WBC 10.0 06/04/2017   HGB 11.5 (L) 06/04/2017   HCT 34.9 (L)  06/04/2017   MCV 93.1 06/04/2017   PLT 257 06/04/2017    Recent Labs Lab 06/04/17 1656  NA 136  K 4.5  CL 105  CO2 23  BUN 14  CREATININE 0.69  CALCIUM 9.2  GLUCOSE 101*   TSH  Recent Labs  06/04/17 1921  TSH 0.853   EKG 06/05/2017: normal EKG, normal sinus rhythm, PAC's noted.   EKG 06/03/2017: Typical atrial flutter with 2:1 AV conduction at the rate of 150 bpm.  No ischemic ST-T abnormality, nonspecific T abnormality evident normal QT interval  Echocardiogram 12/06/2016: Left ventricle cavity is normal in size. Mild concentric hypertrophy of the left ventricle. Normal global wall motion. Doppler evidence of grade I (impaired) diastolic dysfunction, normal LAP. Calculated EF 67%. Left atrial cavity is moderately dilated AP diameter 4.3 cm, but appears larger. Mild tricuspid regurgitation. No evidence of pulmonary hypertension.  Radiology: Dg Chest Port 1 View  Result Date: 06/04/2017 CLINICAL DATA:  Two days of tachycardia. EXAM: PORTABLE CHEST 1 VIEW COMPARISON:  Chest radiograph February 21, 2010 FINDINGS: Cardiomediastinal silhouette is normal. No pleural effusion or focal consolidation. No pneumothorax. Osteopenia. Broad thoracic levoscoliosis. IMPRESSION: No acute cardiopulmonary process. Electronically Signed   By: Elon Alas M.D.   On: 06/04/2017 17:28      FOLLOW UP PLANS AND APPOINTMENTS  Allergies as of 06/05/2017   No Known Allergies     Medication List    STOP taking these medications   lisinopril 20 MG tablet Commonly known as:  PRINIVIL,ZESTRIL     TAKE these medications   alendronate 70 MG tablet Commonly known as:  FOSAMAX Take 70 mg by  mouth once a week. MONDAYS   apixaban 5 MG Tabs tablet Commonly known as:  ELIQUIS Take 1 tablet (5 mg total) by mouth 2 (two) times daily.   BIOTIN PO Take 1 tablet by mouth daily.   CALCIUM + D PO Take 1 tablet by mouth 2 (two) times daily.   metoprolol tartrate 50 MG tablet Commonly known  as:  LOPRESSOR Take 1 tablet (50 mg total) by mouth 2 (two) times daily.      Follow-up Information    Adrian Prows, MD Follow up on 06/20/2017.   Specialty:  Cardiology Why:  4 pm appointment. Arrive 15 minutes early Contact information: [address] [place] [address] [phone]        Evans Lance, MD Follow up.   Specialty:  Cardiology Why:  call our office if you would like to proceed with ablation  Contact information: [address] [place] [address] [phone]          Adrian Prows, MD 06/05/2017, 1:04 PM  Pager: [phone] Office: [phone] If no answer: [phone]

## 2017-06-05 NOTE — Care Management Note (Addendum)
Case Management Note  Patient Details  Name: Alexa Hansen MRN: [id number] Date of Birth: Dec 05, 1946  Subjective/Objective: Pt presented for New Onset Atrial Flutter. Pt is from home with husband. Plan to return home once stable. PTA independent-No DME.                   Action/Plan: No home needs identified. Benefits Check for Eliquis in process and will make pt aware of cost once completed. 30 day free card to be provided as well. No further needs at this time.   Expected Discharge Date:                  Expected Discharge Plan:  Home/Self Care  In-House Referral:  NA  Discharge planning Services  CM Consult, Medication Assistance  Post Acute Care Choice:  NA Choice offered to:  NA  DME Arranged:  N/A DME Agency:  NA  HH Arranged:  NA HH Agency:  NA  Status of Service:  Completed, signed off  If discussed at [place] of Stay Meetings, dates discussed:    Additional Comments: CRYSTAL  @ [facility] PI # [phone]    1. ELIQUIS 2.5 MG BID   COVER- YES  CO-PAY- $ 42.00  TIER- 3 DRUG  PRIOR APPROVAL- NO   2.ELIQUIS 5 MG BID   COVER- YES  CO-PAY- $ 42.00  TIER- 3 DRUG  PRIOR APPROVAL- NO   PREFERRED PHARMACY : [facility] AND [facility]    Bethena Roys, RN 06/05/2017, 12:42 PM

## 2017-06-05 NOTE — Care Management Obs Status (Signed)
[place] NOTIFICATION   Patient Details  Name: Alexa Hansen MRN: [id number] Date of Birth: 1946/10/21   Medicare Observation Status Notification Given:  Yes    Bethena Roys, RN 06/05/2017, 12:42 PM

## 2017-06-12 ENCOUNTER — Encounter (HOSPITAL_COMMUNITY)
Admission: RE | Disposition: A | Discharge status: Home / Self Care | Payer: Self-pay | Source: Ambulatory Visit | Attending: Internal Medicine

## 2017-06-12 ENCOUNTER — Ambulatory Visit (HOSPITAL_COMMUNITY)
Admission: RE | Admit: 2017-06-12 | Discharge: 2017-06-13 | Disposition: A | Discharge status: Home / Self Care | Payer: Medicare HMO | Source: Ambulatory Visit | Attending: Internal Medicine | Admitting: Internal Medicine

## 2017-06-12 DIAGNOSIS — Z7901 Long term (current) use of anticoagulants: Secondary | ICD-10-CM | POA: Diagnosis not present

## 2017-06-12 DIAGNOSIS — I4892 Unspecified atrial flutter: Secondary | ICD-10-CM | POA: Diagnosis present

## 2017-06-12 DIAGNOSIS — I4891 Unspecified atrial fibrillation: Secondary | ICD-10-CM | POA: Diagnosis not present

## 2017-06-12 DIAGNOSIS — I10 Essential (primary) hypertension: Secondary | ICD-10-CM | POA: Insufficient documentation

## 2017-06-12 DIAGNOSIS — I483 Typical atrial flutter: Secondary | ICD-10-CM | POA: Diagnosis not present

## 2017-06-12 HISTORY — PX: A-FLUTTER ABLATION: EP1230

## 2017-06-12 SURGERY — A-FLUTTER ABLATION
Anesthesia: LOCAL

## 2017-06-12 MED ORDER — METOPROLOL TARTRATE 5 MG/5ML IV SOLN
INTRAVENOUS | Status: DC | PRN
  Administered 2017-06-12: 5 mg via INTRAVENOUS

## 2017-06-12 MED ORDER — ACETAMINOPHEN 325 MG PO TABS: 650.0000 mg | ORAL_TABLET | ORAL | Status: DC | PRN

## 2017-06-12 MED ORDER — MIDAZOLAM HCL 5 MG/5ML IJ SOLN
INTRAMUSCULAR | Status: AC
  Filled 2017-06-12: qty 5

## 2017-06-12 MED ORDER — METOPROLOL TARTRATE 5 MG/5ML IV SOLN
INTRAVENOUS | Status: AC
  Filled 2017-06-12: qty 5

## 2017-06-12 MED ORDER — SODIUM CHLORIDE 0.9 % IV SOLN: 250.0000 mL | INTRAVENOUS | Status: DC | PRN

## 2017-06-12 MED ORDER — SODIUM CHLORIDE 0.9% FLUSH
3.0000 mL | Freq: Two times a day (BID) | INTRAVENOUS | Status: DC
  Administered 2017-06-12: 3 mL via INTRAVENOUS

## 2017-06-12 MED ORDER — MIDAZOLAM HCL 5 MG/5ML IJ SOLN
INTRAMUSCULAR | Status: DC | PRN
  Administered 2017-06-12: 2 mg via INTRAVENOUS
  Administered 2017-06-12: 2 mg
  Administered 2017-06-12: 1 mg
  Administered 2017-06-12 (×2): 2 mg
  Administered 2017-06-12 (×2): 1 mg

## 2017-06-12 MED ORDER — DILTIAZEM HCL 100 MG IV SOLR: 5.0000 mg/h | INTRAVENOUS | Status: DC

## 2017-06-12 MED ORDER — BUPIVACAINE HCL (PF) 0.25 % IJ SOLN
INTRAMUSCULAR | Status: AC
  Filled 2017-06-12: qty 60

## 2017-06-12 MED ORDER — HEPARIN (PORCINE) IN NACL 2-0.9 UNIT/ML-% IJ SOLN
INTRAMUSCULAR | Status: AC
  Filled 2017-06-12: qty 500

## 2017-06-12 MED ORDER — FENTANYL CITRATE (PF) 100 MCG/2ML IJ SOLN
INTRAMUSCULAR | Status: AC
  Filled 2017-06-12: qty 2

## 2017-06-12 MED ORDER — METOPROLOL TARTRATE 50 MG PO TABS
50.0000 mg | ORAL_TABLET | Freq: Two times a day (BID) | ORAL | Status: DC
  Filled 2017-06-12: qty 1
  Filled 2017-06-12: qty 2

## 2017-06-12 MED ORDER — BUPIVACAINE HCL (PF) 0.25 % IJ SOLN
INTRAMUSCULAR | Status: DC | PRN
  Administered 2017-06-12: 40 mL

## 2017-06-12 MED ORDER — ONDANSETRON HCL 4 MG/2ML IJ SOLN: 4.0000 mg | Freq: Four times a day (QID) | INTRAMUSCULAR | Status: DC | PRN

## 2017-06-12 MED ORDER — FENTANYL CITRATE (PF) 100 MCG/2ML IJ SOLN
INTRAMUSCULAR | Status: DC | PRN
  Administered 2017-06-12: 12.5 ug
  Administered 2017-06-12: 25 ug via INTRAVENOUS
  Administered 2017-06-12: 25 ug
  Administered 2017-06-12: 12.5 ug
  Administered 2017-06-12: 25 ug
  Administered 2017-06-12: 12.5 ug

## 2017-06-12 MED ORDER — SODIUM CHLORIDE 0.9 % IV SOLN
INTRAVENOUS | Status: DC
  Administered 2017-06-12: 14:00:00 via INTRAVENOUS

## 2017-06-12 MED ORDER — HEPARIN (PORCINE) IN NACL 2-0.9 UNIT/ML-% IJ SOLN
INTRAMUSCULAR | Status: AC | PRN
  Administered 2017-06-12: 500 mL

## 2017-06-12 MED ORDER — SODIUM CHLORIDE 0.9% FLUSH: 3.0000 mL | INTRAVENOUS | Status: DC | PRN

## 2017-06-12 SURGICAL SUPPLY — 10 items
BAG SNAP BAND KOVER 36X36 (MISCELLANEOUS) ×1 IMPLANT
CATH BLAZERPRIME XP (ABLATOR) ×2 IMPLANT
CATH DUODECA HALO/ISMUS 7FR (CATHETERS) ×1 IMPLANT
CATH POLARIS X 2.5/5/2.5 DECAP (CATHETERS) ×1 IMPLANT
PACK EP LATEX FREE (CUSTOM PROCEDURE TRAY) ×2
PACK EP LF (CUSTOM PROCEDURE TRAY) IMPLANT
PAD DEFIB LIFELINK (PAD) ×1 IMPLANT
SHEATH PINNACLE 6F 10CM (SHEATH) ×1 IMPLANT
SHEATH PINNACLE 8F 10CM (SHEATH) ×2 IMPLANT
SHIELD RADPAD SCOOP 12X17 (MISCELLANEOUS) ×1 IMPLANT

## 2017-06-12 NOTE — Progress Notes (Signed)
Site area: rt groin fv sheaths x 3 Site Prior to Removal:  Level 0 Pressure Applied For: 20 minutes Manual:   yes Patient Status During Pull:  stable Post Pull Site:  Level 0 Post Pull Instructions Given:  yes Post Pull Pulses Present: palpable Dressing Applied:  Gauze and tegaderm Bedrest begins @ 1840 Comments: IV SALINE LOCKED

## 2017-06-12 NOTE — H&P (View-Only) (Signed)
ELECTROPHYSIOLOGY CONSULT NOTE    Patient ID: Alexa Hansen MRN: [id number], DOB/AGE: 08-22-1946 71 y.o.  Admit date: 06/04/2017 Date of Consult: 06/05/2017  Primary Physician: Leighton Ruff, MD Primary Cardiologist: Einar Gip  Reason for Consultation: atrial flutter  HPI:  Alexa Hansen is a 71 y.o. female is referred by Dr Einar Gip for evaluation of atrial flutter.  Her past medical history is notable for hypertension. She went to her PCP's office on the day of admission for evaluation of tachycardia which was relatively asymptomatic. She was found to be in new onset atrial flutter and transferred to [facility] for further evaluation.  She has converted on IV diltiazem. EP has been asked to evaluate for consideration of ablation.  While she did not have typical symptoms of atrial flutter, she has noticed intermittent fatigue and exercise intolerance over the last few weeks. These episodes do not have clear triggers and terminate spontaneously. She is not able to qualify them more precisely.    She has also had syncope in the past that has been felt to be vasovagal. Echo done at Dr Irven Shelling office showed normal LV function without significant valvular abnormalities or LA enlargement.    She currently feels well without chest pain, shortness of breath, LE edema, recent nausea, vomiting, fevers, or chills.   Past Medical History:  Diagnosis Date  . Atrial flutter ([place])   . Breast cancer ([place])   . Hypertension   . Syncope      Surgical History:  Past Surgical History:  Procedure Laterality Date  . BREAST SURGERY    . MOUTH SURGERY     to remove torus  . TONSILLECTOMY    . VEIN LIGATION     Dr Deon Pilling     Prescriptions Prior to Admission  Medication Sig Dispense Refill Last Dose  . alendronate (FOSAMAX) 70 MG tablet Take 70 mg by mouth once a week. MONDAYS  2 06/03/2017 at am  . BIOTIN PO Take 1 tablet by mouth daily.    06/04/2017 at am  . Calcium Carbonate-Vitamin D (CALCIUM + D PO)  Take 1 tablet by mouth 2 (two) times daily.    06/04/2017 at am  . lisinopril (PRINIVIL,ZESTRIL) 20 MG tablet Take 20 mg by mouth every evening.   2 06/03/2017 at pm    Inpatient Medications:  . apixaban  5 mg Oral BID  . metoprolol tartrate  50 mg Oral BID  . sodium chloride flush  3 mL Intravenous Q12H    Allergies: No Known Allergies  Social History   Social History  . Marital status: Married    Spouse name: N/A  . Number of children: N/A  . Years of education: N/A   Occupational History  . Not on file.   Social History Main Topics  . Smoking status: Never Smoker  . Smokeless tobacco: Never Used  . Alcohol use No  . Drug use: No  . Sexual activity: No   Other Topics Concern  . Not on file   Social History Narrative  . No narrative on file     Family History  Problem Relation Age of Onset  . Colon cancer Neg Hx      Review of Systems: All other systems reviewed and are otherwise negative except as noted above.  Physical Exam: Vitals:   06/05/17 0100 06/05/17 0401 06/05/17 0735 06/05/17 0800  BP: 103/64 109/68 (!) 89/64 (!) 97/58  Pulse: (!) 57 70 78 (!) 49  Resp: 13 16 18  10  Temp:  98.3 F (36.8 C) 98 F (36.7 C)   TempSrc:  Oral Oral   SpO2: 100% 98% 100% 99%  Weight:  132 lb 7.9 oz (60.1 kg)    Height:        GEN- The patient is well appearing, alert and oriented x 3 today.   HEENT: normocephalic, atraumatic; sclera clear, conjunctiva pink; hearing intact; oropharynx clear; neck supple  Lungs- Clear to ausculation bilaterally, normal work of breathing.  No wheezes, rales, rhonchi Heart- Regular rate and rhythm, frequent ectopy GI- soft, non-tender, non-distended, bowel sounds present Extremities- no clubbing, cyanosis, or edema; DP/PT/radial pulses 2+ bilaterally MS- no significant deformity or atrophy Skin- warm and dry, no rash or lesion Psych- euthymic mood, full affect Neuro- strength and sensation are intact  Labs:   Lab Results    Component Value Date   WBC 10.0 06/04/2017   HGB 11.5 (L) 06/04/2017   HCT 34.9 (L) 06/04/2017   MCV 93.1 06/04/2017   PLT 257 06/04/2017     Recent Labs Lab 06/04/17 1656  NA 136  K 4.5  CL 105  CO2 23  BUN 14  CREATININE 0.69  CALCIUM 9.2  GLUCOSE 101*      Radiology/Studies: Dg Chest Port 1 View  Result Date: 06/04/2017 CLINICAL DATA:  Two days of tachycardia. EXAM: PORTABLE CHEST 1 VIEW COMPARISON:  Chest radiograph February 21, 2010 FINDINGS: Cardiomediastinal silhouette is normal. No pleural effusion or focal consolidation. No pneumothorax. Osteopenia. Broad thoracic levoscoliosis. IMPRESSION: No acute cardiopulmonary process. Electronically Signed   By: Elon Alas M.D.   On: 06/04/2017 17:28    SPQ:ZRAQTMA atrial flutter, V rate 149  (personally reviewed)  TELEMETRY: atrial flutter with conversion to SR without post termination pause (personally reviewed)   Assessment/Plan: 1.  Typical atrial flutter We spent a significant amount of time discussing pathophysiology as well as treatment options. Risks, benefits to ablation reviewed. She has had recurrent atrial flutter this afternoon, but remains asymptomatic.  She would like to proceed with ablation.  Will plan as an outpatient. Dr Lovena Le has also reviewed with patient.  Continue Eliquis until after ablation for [facility] of 3 Flutter ablation scheduled for next Wednesday 06/12/17 with Dr Lovena Le. Instructions entered in AVS  2.  HTN Stable No change required today  Ok from our standpoint to discharge home.   Signed, Chanetta Marshall, NP 06/05/2017 11:11 AM  EP Attending  Patient seen and examined. Agree with above. The patient is a pleasant woman with minimally symptomatic atrial flutter with a RVR who presented and was treated with IV cardizem with subsequent re-initiation of atrial flutter. She has been placed on anti-coagulation. On exam she is a pleasant woman with clear lungs and a regular tachycardia and  no edema. Abdomen is soft and nontender. Neuro is normal ECG reveals probable typical atrial flutter.  A/P 1. Atrial flutter with a RVR - I have discussed the treatment options with the patient and the risks/benefits/goals/expectations of EP study and ablation were reviewed and she will return next week to undergo ablation.  2. coags - she has been started on systemic anti-coagulation.  3. HTN - she appears stable on medical therapy.  Mikle Bosworth.D.

## 2017-06-12 NOTE — Interval H&P Note (Signed)
History and Physical Interval Note:  06/12/2017 2:17 PM  Alexa Hansen  has presented today for surgery, with the diagnosis of a flutter  The various methods of treatment have been discussed with the patient and family. After consideration of risks, benefits and other options for treatment, the patient has consented to  Procedure(s): A-Flutter Ablation (N/A) as a surgical intervention .  The patient's history has been reviewed, patient examined, no change in status, stable for surgery.  I have reviewed the patient's chart and labs.  Questions were answered to the patient's satisfaction.     [name]

## 2017-06-13 ENCOUNTER — Encounter (HOSPITAL_COMMUNITY): Payer: Self-pay | Admitting: Internal Medicine

## 2017-06-13 ENCOUNTER — Other Ambulatory Visit: Payer: Self-pay | Admitting: Physician Assistant

## 2017-06-13 DIAGNOSIS — I483 Typical atrial flutter: Secondary | ICD-10-CM

## 2017-06-13 DIAGNOSIS — I10 Essential (primary) hypertension: Secondary | ICD-10-CM | POA: Diagnosis not present

## 2017-06-13 DIAGNOSIS — I480 Paroxysmal atrial fibrillation: Secondary | ICD-10-CM

## 2017-06-13 DIAGNOSIS — I482 Chronic atrial fibrillation, unspecified: Secondary | ICD-10-CM

## 2017-06-13 DIAGNOSIS — I4891 Unspecified atrial fibrillation: Secondary | ICD-10-CM | POA: Diagnosis not present

## 2017-06-13 DIAGNOSIS — Z7901 Long term (current) use of anticoagulants: Secondary | ICD-10-CM | POA: Diagnosis not present

## 2017-06-13 MED ORDER — METOPROLOL TARTRATE 25 MG PO TABS
25.0000 mg | ORAL_TABLET | Freq: Two times a day (BID) | ORAL | Status: DC
  Administered 2017-06-13: 25 mg via ORAL

## 2017-06-13 MED ORDER — FLECAINIDE ACETATE 50 MG PO TABS: 50.0000 mg | ORAL_TABLET | Freq: Two times a day (BID) | ORAL | 3 refills | Status: DC

## 2017-06-13 MED ORDER — METOPROLOL TARTRATE 25 MG PO TABS
25.0000 mg | ORAL_TABLET | Freq: Two times a day (BID) | ORAL | 4 refills | Status: DC
Start: 2017-06-13 — End: 2017-09-24

## 2017-06-13 MED ORDER — METOPROLOL TARTRATE 25 MG PO TABS: 25.0000 mg | ORAL_TABLET | Freq: Two times a day (BID) | ORAL | 3 refills | Status: DC

## 2017-06-13 MED ORDER — FLECAINIDE ACETATE 50 MG PO TABS: 50.0000 mg | ORAL_TABLET | Freq: Two times a day (BID) | ORAL | 2 refills | Status: DC

## 2017-06-13 NOTE — Discharge Instructions (Signed)
No driving for 4 days. No lifting over 5 lbs for 1 week. No vigorous or sexual activity for 1 week. You may return to work on 06/19/17. Keep procedure site clean & dry. If you notice increased pain, swelling, bleeding or pus, call/return!  You may shower, but no soaking baths/hot tubs/pools for 1 week.

## 2017-06-13 NOTE — Discharge Summary (Signed)
ELECTROPHYSIOLOGY PROCEDURE DISCHARGE SUMMARY    Patient ID: Alexa Hansen,  MRN: [id number], DOB/AGE: 71-03-1946 71 y.o.  Admit date: 06/12/2017 Discharge date: 06/13/17  Primary Care Physician: Leighton Ruff, MD Primary Cardiologist: Dr. Einar Gip Electrophysiologist: Dr. Lovena Le  Primary Discharge Diagnosis:  1. Atrial flutter status post ablation this admission     CHA2DS2Vasc is at least 3 on Eliquis  Secondary Discharge Diagnosis:  1. HTN  No Known Allergies   Procedures This Admission: 1.  Electrophysiology study and radiofrequency catheter ablation on 06/12/17 by Dr Lovena Le.   This study demonstrated  Conclusion: Successful EP study and catheter ablation of the atrial flutter isthmus resulting in creation of atrial flutter isthmus block. Despite never having documented atrial fibrillation, the patient had spontaneous occurring atrial fibrillation during the procedure. There were no inducible arrhythmias following ablation and no early apparent complications.   Brief HPI: Alexa Hansen is a 71 y.o. female with a past medical history as outlined above.  She has documented atrial flutter.  Risks, benefits, and alternatives to ablation were reviewed with the patient who wished to proceed.   Hospital Course:  The patient was admitted and underwent EPS/RFCA with details as outlined above. She was monitored on telemetry overnight which demonstrated SB 40's overnight-50's this AM.  Groin site is without complication.  The patient was examined by Dr. Lovena Le who considered her stable for discharge to home.  Follow up has been  arranged in 4 weeks.  Wound care and restrictions were reviewed with the patient prior to discharge.   Will stop diltiazem and start Flecainide with her lopressor given the AFib noted during her procedure, with EKG in a week scheduled.  Patient is ambulating in the room comfortably without difficulty and feels well, documented hypotension in vitals last evening,  though this AM 108/63, doubt accuracy of low readings.  Physical Exam: Vitals:   06/12/17 2116 06/12/17 2315 06/12/17 2357 06/13/17 0626  BP: (!) 89/49 (!) 100/51 (!) 86/47 108/63  Pulse: (!) 58 (!) 52 (!) 51 (!) 56  Resp:  17 15 15   Temp:  98.4 F (36.9 C)  98.7 F (37.1 C)  TempSrc:  Axillary  Oral  SpO2:  94% 94% 99%  Weight:    136 lb 0.4 oz (61.7 kg)  Height:        GEN- The patient is well appearing, alert and oriented x 3 today.   HEENT: normocephalic, atraumatic; sclera clear, conjunctiva pink; hearing intact; oropharynx clear; neck supple, no JVP Lungs- CTA b/l, normal work of breathing.  No wheezes, rales, rhonchi Heart- RRR, no murmurs, rubs or gallops, PMI not laterally displaced GI- soft, non-tender, non-distended Extremities- no clubbing, cyanosis, or edema; DP/PT/radial pulses 2+ bilaterally, groin without hematoma/bruit MS- no significant deformity or atrophy Skin- warm and dry, no rash or lesion Psych- euthymic mood, full affect Neuro- strength and sensation are intact   Labs:   Lab Results  Component Value Date   WBC 10.0 06/04/2017   HGB 11.5 (L) 06/04/2017   HCT 34.9 (L) 06/04/2017   MCV 93.1 06/04/2017   PLT 257 06/04/2017   No results for input(s): NA, K, CL, CO2, BUN, CREATININE, CALCIUM, PROT, BILITOT, ALKPHOS, ALT, AST, GLUCOSE in the last 168 hours.  Invalid input(s): LABALBU  Discharge Medications:  Allergies as of 06/13/2017   No Known Allergies     Medication List    STOP taking these medications   diltiazem 180 MG 24 hr capsule Commonly known as:  CARDIZEM CD     TAKE these medications   alendronate 70 MG tablet Commonly known as:  FOSAMAX Take 70 mg by mouth once a week. MONDAYS   apixaban 5 MG Tabs tablet Commonly known as:  ELIQUIS Take 1 tablet (5 mg total) by mouth 2 (two) times daily.   BIOTIN PO Take 1 tablet by mouth daily.   CALCIUM + D PO Take 1 tablet by mouth 2 (two) times daily.   flecainide 50 MG  tablet Commonly known as:  TAMBOCOR Take 1 tablet (50 mg total) by mouth 2 (two) times daily.   metoprolol tartrate 25 MG tablet Commonly known as:  LOPRESSOR Take 1 tablet (25 mg total) by mouth 2 (two) times daily. What changed:  medication strength  how much to take       Disposition:  Home Discharge Instructions    Diet - low sodium heart healthy    Complete by:  As directed    Increase activity slowly    Complete by:  As directed      Follow-up Information    Evans Lance, MD Follow up on 07/16/2017.   Specialty:  Cardiology Why:  9:00AM Contact information: [address] [address] [phone]        [place] Office Follow up on 06/21/2017.   Specialty:  Cardiology Why:  9:00AM, EKG Contact information: [address], [place] [phone]          Duration of Discharge Encounter: Greater than 30 minutes including physician time.  Venetia Night, PA-C 06/13/2017 10:30 AM  EP Attending  Patient seen and examined. Agree with above. The patient is doing well s/p EP study and catheter ablation. Because she spontaneously developed atrial fib during the procedure, I will place her on low dose flecainide with her beta blocker and hold cardizem. If she maintains NSR, would anticipate having her stop the flecainide in a month.  Mikle Bosworth.D.

## 2017-06-20 DIAGNOSIS — I10 Essential (primary) hypertension: Secondary | ICD-10-CM | POA: Diagnosis not present

## 2017-06-20 DIAGNOSIS — Z9889 Other specified postprocedural states: Secondary | ICD-10-CM | POA: Diagnosis not present

## 2017-06-20 DIAGNOSIS — Z8679 Personal history of other diseases of the circulatory system: Secondary | ICD-10-CM | POA: Diagnosis not present

## 2017-06-20 DIAGNOSIS — I4892 Unspecified atrial flutter: Secondary | ICD-10-CM | POA: Diagnosis not present

## 2017-06-21 ENCOUNTER — Ambulatory Visit (INDEPENDENT_AMBULATORY_CARE_PROVIDER_SITE_OTHER): Payer: Medicare HMO

## 2017-06-21 VITALS — BP 150/86 | HR 49 | Wt 133.1 lb

## 2017-06-21 DIAGNOSIS — I4892 Unspecified atrial flutter: Secondary | ICD-10-CM | POA: Diagnosis not present

## 2017-06-21 NOTE — Patient Instructions (Addendum)
1.) Reason for visit: EKG for Flecainide start on 06/12/17  2.) Name of MD requesting visit: Dr. Lovena Le        3.) H&P: A-flutter ablation on 06/12/17.         4.) ROS related to problem: Pt spontaneously developed atrial fib during ablation procedure on 06/12/17. Pt placed on            low dose Flecianide with beta blocker and hold Cardizem. Pt stated she had appointment with Dr. Einar Gip yesterday          (06/20/17).  Dr. Einar Gip increased Flecainide to 100 mg twice daily. Pt stated she has a follow-up appointment with Dr.          Einar Gip in 10  days.   5.) Assessment and plan per MD: Dr. Tamala Julian reviewed and signed EKG. Dr. Tamala Julian stated to continue current medications and forward Flecainide increase to Dr. Lovena Le to review and advise. Informed someone from our office will contact pt if Dr. Lovena Le has any further recommendations. Reminded of pt's follow-up appointment with Dr. Lovena Le on 07/16/17. Informed pt to call our office with further questions or concerns.

## 2017-07-01 DIAGNOSIS — I10 Essential (primary) hypertension: Secondary | ICD-10-CM | POA: Diagnosis not present

## 2017-07-01 DIAGNOSIS — Z9889 Other specified postprocedural states: Secondary | ICD-10-CM | POA: Diagnosis not present

## 2017-07-01 DIAGNOSIS — I4892 Unspecified atrial flutter: Secondary | ICD-10-CM | POA: Diagnosis not present

## 2017-07-01 DIAGNOSIS — Z8679 Personal history of other diseases of the circulatory system: Secondary | ICD-10-CM | POA: Diagnosis not present

## 2017-07-01 NOTE — Addendum Note (Signed)
Addended by: Willeen Cass A on: 07/01/2017 07:14 AM   Modules accepted: Orders

## 2017-07-02 ENCOUNTER — Encounter: Payer: Self-pay | Admitting: *Deleted

## 2017-07-02 DIAGNOSIS — R69 Illness, unspecified: Secondary | ICD-10-CM | POA: Diagnosis not present

## 2017-07-05 ENCOUNTER — Other Ambulatory Visit: Payer: Self-pay | Admitting: Physician Assistant

## 2017-07-16 ENCOUNTER — Ambulatory Visit (INDEPENDENT_AMBULATORY_CARE_PROVIDER_SITE_OTHER): Payer: Medicare HMO | Admitting: Internal Medicine

## 2017-07-16 ENCOUNTER — Encounter: Payer: Self-pay | Admitting: Internal Medicine

## 2017-07-16 VITALS — BP 160/70 | HR 58 | Ht 62.0 in | Wt 132.4 lb

## 2017-07-16 DIAGNOSIS — I4892 Unspecified atrial flutter: Secondary | ICD-10-CM

## 2017-07-16 NOTE — Progress Notes (Signed)
HPI Alexa Hansen returns today for followup. She is a pleasant is a 71 yo woman with atrial flutter who was noted to have PAF during her ablation procedure. I discharged her on low dose flecainide. She was seen by Dr. Einar Hansen earlier this month and found to be in atrial fib/flutter. Her ventricular rate was fast and irregular. She had her dose of flecainide increased to 100 bid and her metoprolol decreased to 12.5 bid. She has many complaints and a plethora of questions to ask. The patient has a log of her HR's and blood pressures. Her HR's have been in the low to mid 50's and her BP's have been well controlled. She has only had the single episode of palpitations. She gets a bad taste in her mouth with the flecainide. No Known Allergies   Current Outpatient Prescriptions  Medication Sig Dispense Refill  . alendronate (FOSAMAX) 70 MG tablet Take 70 mg by mouth once a week. MONDAYS  2  . apixaban (ELIQUIS) 5 MG TABS tablet Take 1 tablet (5 mg total) by mouth 2 (two) times daily. 60 tablet 3  . BIOTIN PO Take 1 tablet by mouth daily.     . Calcium Carbonate-Vitamin D (CALCIUM + D PO) Take 1 tablet by mouth 2 (two) times daily.     . flecainide (TAMBOCOR) 50 MG tablet Take 1 tablet (50 mg total) by mouth 2 (two) times daily. (Patient taking differently: Take 100 mg by mouth 2 (two) times daily. ) 60 tablet 2  . lisinopril (PRINIVIL,ZESTRIL) 20 MG tablet Take 20 mg by mouth daily.  2  . metoprolol tartrate (LOPRESSOR) 25 MG tablet Take 1 tablet (25 mg total) by mouth 2 (two) times daily. (Patient taking differently: Take 12.5 mg by mouth 2 (two) times daily. ) 60 tablet 4   No current facility-administered medications for this visit.      Past Medical History:  Diagnosis Date  . Atrial flutter ([place])   . Breast cancer ([place])   . Hypertension   . Syncope     ROS:   All systems reviewed and negative except as noted in the HPI.   Past Surgical History:  Procedure Laterality Date  .  A-FLUTTER ABLATION N/A 06/12/2017   Procedure: A-Flutter Ablation;  Surgeon: Alexa Lance, MD;  Location: [facility];  Service: Cardiovascular;  Laterality: N/A;  . BREAST SURGERY    . MOUTH SURGERY     to remove torus  . TONSILLECTOMY    . VEIN LIGATION     Dr Alexa Hansen     Family History  Problem Relation Age of Onset  . Colon cancer Neg Hx      Social History   Social History  . Marital status: Married    Spouse name: N/A  . Number of children: N/A  . Years of education: N/A   Occupational History  . Not on file.   Social History Main Topics  . Smoking status: Never Smoker  . Smokeless tobacco: Never Used  . Alcohol use No  . Drug use: No  . Sexual activity: No   Other Topics Concern  . Not on file   Social History Narrative  . No narrative on file     BP (!) 160/70   Pulse (!) 58   Ht 5\' 2"  (1.575 m)   Wt 132 lb 6.4 oz (60.1 kg)   SpO2 99%   BMI 24.22 kg/m   Physical Exam:  Well appearing  71 yo woman, NAD HEENT: Unremarkable Neck:  6 cm JVD, no thyromegally Lymphatics:  No adenopathy Back:  No CVA tenderness Lungs:  Clear with no wheezes HEART:  Regular rate rhythm, no murmurs, no rubs, no clicks Abd:  soft, positive bowel sounds, no organomegally, no rebound, no guarding Ext:  2 plus pulses, no edema, no cyanosis, no clubbing Skin:  No rashes no nodules Neuro:  CN II through XII intact, motor grossly intact  EKG - sinus bradycardia    Assess/Plan: 1. Atrial flutter - she is s/p catheter ablation 2. Atrial fib - her ECG from Dr. Irven Hansen office is coarse atrial fib. I have recommended she continue her flecainide and metoprolol. She will undergo an exercise test on the higher dose of flecainide to rule out pro-arrhythmia and QRS widening on the flecainide. We discussed catheter ablation of atrial fib but at this point she has not failed her flecainide.  3. Coags - I have recommended she continue her Eliquis 4. Sinus node dysfunction -  this is exacerbated by the flecainide. She may ultimately require a PPM but no indication at this time.

## 2017-07-16 NOTE — Patient Instructions (Addendum)
Medication Instructions:  Your physician recommends that you continue on your current medications as directed. Please refer to the Current Medication list given to you today.   Labwork: None ordered.   Testing/Procedures: Your physician has requested that you have an exercise tolerance test. For further information please visit [email or web address]. Please also follow instruction sheet, as given.    Follow-Up: Your follow up will be determined by the results of your test.     Any Other Special Instructions Will Be Listed Below (If Applicable).     If you need a refill on your cardiac medications before your next appointment, please call your pharmacy.

## 2017-07-18 ENCOUNTER — Ambulatory Visit (INDEPENDENT_AMBULATORY_CARE_PROVIDER_SITE_OTHER): Payer: Medicare HMO

## 2017-07-18 DIAGNOSIS — I4892 Unspecified atrial flutter: Secondary | ICD-10-CM

## 2017-07-18 LAB — EXERCISE TOLERANCE TEST
CHL CUP MPHR: 149 {beats}/min
CHL CUP RESTING HR STRESS: 67 {beats}/min
CSEPEDS: 20 s
CSEPPHR: 127 {beats}/min
Estimated workload: 7.7 METS
Exercise duration (min): 7 min
Percent HR: 85 %
RPE: 15

## 2017-08-26 DIAGNOSIS — Z79899 Other long term (current) drug therapy: Secondary | ICD-10-CM | POA: Diagnosis not present

## 2017-08-26 DIAGNOSIS — H524 Presbyopia: Secondary | ICD-10-CM | POA: Diagnosis not present

## 2017-08-26 DIAGNOSIS — Z01812 Encounter for preprocedural laboratory examination: Secondary | ICD-10-CM | POA: Diagnosis not present

## 2017-08-26 DIAGNOSIS — Z5181 Encounter for therapeutic drug level monitoring: Secondary | ICD-10-CM | POA: Diagnosis not present

## 2017-08-26 DIAGNOSIS — H35033 Hypertensive retinopathy, bilateral: Secondary | ICD-10-CM | POA: Diagnosis not present

## 2017-09-11 DIAGNOSIS — R69 Illness, unspecified: Secondary | ICD-10-CM | POA: Diagnosis not present

## 2017-09-24 ENCOUNTER — Ambulatory Visit (INDEPENDENT_AMBULATORY_CARE_PROVIDER_SITE_OTHER): Payer: Medicare HMO | Admitting: Internal Medicine

## 2017-09-24 ENCOUNTER — Encounter: Payer: Self-pay | Admitting: Internal Medicine

## 2017-09-24 VITALS — BP 140/82 | HR 66 | Ht 62.0 in | Wt 132.8 lb

## 2017-09-24 DIAGNOSIS — I4892 Unspecified atrial flutter: Secondary | ICD-10-CM | POA: Diagnosis not present

## 2017-09-24 DIAGNOSIS — I4891 Unspecified atrial fibrillation: Secondary | ICD-10-CM | POA: Diagnosis not present

## 2017-09-24 NOTE — Progress Notes (Signed)
HPI Alexa Hansen returns today for ongoing evaluation of atrial fibrillation. She is a very pleasant 71 year old woman who developed atrial flutter and underwent catheter ablation. The patient during her procedure had spontaneous occurring atrial fibrillation. After discharge, she went in to atrial fibrillation within a few weeks, and has been placed on flecainide therapy. She has done well in the interim with no symptomatic atrial fibrillation. She does have some sinus node dysfunction but is asymptomatic. She remains active and denies chest pain or shortness of breath. She has many questions regarding her flecainide and duration of therapy and other treatment options which I tried to answer today. No Known Allergies   Current Outpatient Prescriptions  Medication Sig Dispense Refill  . alendronate (FOSAMAX) 70 MG tablet Take 70 mg by mouth once a week. MONDAYS  2  . apixaban (ELIQUIS) 5 MG TABS tablet Take 1 tablet (5 mg total) by mouth 2 (two) times daily. 60 tablet 3  . BIOTIN PO Take 1 tablet by mouth daily.     . Calcium Carbonate-Vitamin D (CALCIUM + D PO) Take 1 tablet by mouth 2 (two) times daily.     . flecainide (TAMBOCOR) 100 MG tablet Take 100 mg by mouth 2 (two) times daily.    Marland Kitchen lisinopril (PRINIVIL,ZESTRIL) 20 MG tablet Take 20 mg by mouth daily.  2  . metoprolol tartrate (LOPRESSOR) 25 MG tablet Take 12.5 mg by mouth 2 (two) times daily.     No current facility-administered medications for this visit.      Past Medical History:  Diagnosis Date  . Atrial flutter ([place])   . Breast cancer ([place])   . Hypertension   . Syncope     ROS:   All systems reviewed and negative except as noted in the HPI.   Past Surgical History:  Procedure Laterality Date  . A-FLUTTER ABLATION N/A 06/12/2017   Procedure: A-Flutter Ablation;  Surgeon: Evans Lance, MD;  Location: [facility];  Service: Cardiovascular;  Laterality: N/A;  . BREAST SURGERY    . MOUTH SURGERY     to remove torus  . TONSILLECTOMY    . VEIN LIGATION     Dr Deon Pilling     Family History  Problem Relation Age of Onset  . Colon cancer Neg Hx      Social History   Social History  . Marital status: Married    Spouse name: N/A  . Number of children: N/A  . Years of education: N/A   Occupational History  . Not on file.   Social History Main Topics  . Smoking status: Never Smoker  . Smokeless tobacco: Never Used  . Alcohol use No  . Drug use: No  . Sexual activity: No   Other Topics Concern  . Not on file   Social History Narrative  . No narrative on file     Pulse 66   Ht 5\' 2"  (1.575 m)   Wt 132 lb 12.8 oz (60.2 kg)   SpO2 98%   BMI 24.29 kg/m   Physical Exam:  Well appearing 71 year old woman, NAD HEENT: Unremarkable Neck:  6 cm JVD, no thyromegally Lymphatics:  No adenopathy Back:  No CVA tenderness Lungs:  Clear, with no wheezes, rales, or rhonchi HEART:  Regular rate rhythm, no murmurs, no rubs, no clicks Abd:  soft, positive bowel sounds, no organomegally, no rebound, no guarding Ext:  2 plus pulses, no edema, no cyanosis, no clubbing Skin:  No  rashes no nodules Neuro:  CN II through XII intact, motor grossly intact  EKG - normal sinus rhythm. QRS duration is 102.  Assess/Plan: 1. Paroxysmal atrial fibrillation - she is maintaining sinus rhythm on flecainide. She will continue her current medical therapy per QRS duration is normal. 2. Atrial flutter - she is status post catheter ablation and has had no recurrent atrial flutter 3. Sinus node dysfunction - the patient underwent exercise treadmill testing and had a normal response. Her heart rates at home are in the low 50s typically and she is asymptomatic. Today her heart rate is 66 bpm.  [name], M.D.

## 2017-09-24 NOTE — Patient Instructions (Signed)

## 2017-10-21 DIAGNOSIS — R69 Illness, unspecified: Secondary | ICD-10-CM | POA: Diagnosis not present

## 2018-01-09 DIAGNOSIS — M810 Age-related osteoporosis without current pathological fracture: Secondary | ICD-10-CM | POA: Diagnosis not present

## 2018-01-09 DIAGNOSIS — M8589 Other specified disorders of bone density and structure, multiple sites: Secondary | ICD-10-CM | POA: Diagnosis not present

## 2018-01-29 DIAGNOSIS — I480 Paroxysmal atrial fibrillation: Secondary | ICD-10-CM | POA: Diagnosis not present

## 2018-01-29 DIAGNOSIS — Z8679 Personal history of other diseases of the circulatory system: Secondary | ICD-10-CM | POA: Diagnosis not present

## 2018-01-29 DIAGNOSIS — I10 Essential (primary) hypertension: Secondary | ICD-10-CM | POA: Diagnosis not present

## 2018-01-29 DIAGNOSIS — Z9889 Other specified postprocedural states: Secondary | ICD-10-CM | POA: Diagnosis not present

## 2018-01-30 DIAGNOSIS — M810 Age-related osteoporosis without current pathological fracture: Secondary | ICD-10-CM | POA: Diagnosis not present

## 2018-01-30 DIAGNOSIS — I4891 Unspecified atrial fibrillation: Secondary | ICD-10-CM | POA: Diagnosis not present

## 2018-01-30 DIAGNOSIS — Z7901 Long term (current) use of anticoagulants: Secondary | ICD-10-CM | POA: Diagnosis not present

## 2018-01-30 DIAGNOSIS — Z79899 Other long term (current) drug therapy: Secondary | ICD-10-CM | POA: Diagnosis not present

## 2018-01-30 DIAGNOSIS — I10 Essential (primary) hypertension: Secondary | ICD-10-CM | POA: Diagnosis not present

## 2018-02-19 DIAGNOSIS — Z79899 Other long term (current) drug therapy: Secondary | ICD-10-CM | POA: Diagnosis not present

## 2018-02-19 DIAGNOSIS — Z7901 Long term (current) use of anticoagulants: Secondary | ICD-10-CM | POA: Diagnosis not present

## 2018-02-19 DIAGNOSIS — M810 Age-related osteoporosis without current pathological fracture: Secondary | ICD-10-CM | POA: Diagnosis not present

## 2018-02-24 DIAGNOSIS — Z7901 Long term (current) use of anticoagulants: Secondary | ICD-10-CM | POA: Diagnosis not present

## 2018-02-24 DIAGNOSIS — I10 Essential (primary) hypertension: Secondary | ICD-10-CM | POA: Diagnosis not present

## 2018-02-24 DIAGNOSIS — M810 Age-related osteoporosis without current pathological fracture: Secondary | ICD-10-CM | POA: Diagnosis not present

## 2018-02-24 DIAGNOSIS — I4891 Unspecified atrial fibrillation: Secondary | ICD-10-CM | POA: Diagnosis not present

## 2018-02-24 DIAGNOSIS — Z0000 Encounter for general adult medical examination without abnormal findings: Secondary | ICD-10-CM | POA: Diagnosis not present

## 2018-02-24 DIAGNOSIS — Z853 Personal history of malignant neoplasm of breast: Secondary | ICD-10-CM | POA: Diagnosis not present

## 2018-03-24 DIAGNOSIS — R69 Illness, unspecified: Secondary | ICD-10-CM | POA: Diagnosis not present

## 2018-04-22 DIAGNOSIS — S5002XA Contusion of left elbow, initial encounter: Secondary | ICD-10-CM | POA: Diagnosis not present

## 2018-04-22 DIAGNOSIS — S4992XA Unspecified injury of left shoulder and upper arm, initial encounter: Secondary | ICD-10-CM | POA: Diagnosis not present

## 2018-04-22 DIAGNOSIS — S42025A Nondisplaced fracture of shaft of left clavicle, initial encounter for closed fracture: Secondary | ICD-10-CM | POA: Diagnosis not present

## 2018-04-28 DIAGNOSIS — S42025A Nondisplaced fracture of shaft of left clavicle, initial encounter for closed fracture: Secondary | ICD-10-CM | POA: Diagnosis not present

## 2018-04-28 DIAGNOSIS — M25512 Pain in left shoulder: Secondary | ICD-10-CM | POA: Diagnosis not present

## 2018-04-30 DIAGNOSIS — S42025D Nondisplaced fracture of shaft of left clavicle, subsequent encounter for fracture with routine healing: Secondary | ICD-10-CM | POA: Diagnosis not present

## 2018-04-30 DIAGNOSIS — Z7901 Long term (current) use of anticoagulants: Secondary | ICD-10-CM | POA: Diagnosis not present

## 2018-04-30 DIAGNOSIS — I4891 Unspecified atrial fibrillation: Secondary | ICD-10-CM | POA: Diagnosis not present

## 2018-04-30 DIAGNOSIS — S76012A Strain of muscle, fascia and tendon of left hip, initial encounter: Secondary | ICD-10-CM | POA: Diagnosis not present

## 2018-04-30 DIAGNOSIS — S5002XA Contusion of left elbow, initial encounter: Secondary | ICD-10-CM | POA: Diagnosis not present

## 2018-04-30 DIAGNOSIS — S76012D Strain of muscle, fascia and tendon of left hip, subsequent encounter: Secondary | ICD-10-CM | POA: Diagnosis not present

## 2018-04-30 DIAGNOSIS — R55 Syncope and collapse: Secondary | ICD-10-CM | POA: Diagnosis not present

## 2018-04-30 DIAGNOSIS — S42025A Nondisplaced fracture of shaft of left clavicle, initial encounter for closed fracture: Secondary | ICD-10-CM | POA: Diagnosis not present

## 2018-04-30 DIAGNOSIS — S5002XD Contusion of left elbow, subsequent encounter: Secondary | ICD-10-CM | POA: Diagnosis not present

## 2018-05-01 DIAGNOSIS — I480 Paroxysmal atrial fibrillation: Secondary | ICD-10-CM | POA: Diagnosis not present

## 2018-05-01 DIAGNOSIS — W19XXXA Unspecified fall, initial encounter: Secondary | ICD-10-CM | POA: Diagnosis not present

## 2018-05-01 DIAGNOSIS — I10 Essential (primary) hypertension: Secondary | ICD-10-CM | POA: Diagnosis not present

## 2018-05-01 DIAGNOSIS — Z9889 Other specified postprocedural states: Secondary | ICD-10-CM | POA: Diagnosis not present

## 2018-05-01 DIAGNOSIS — R55 Syncope and collapse: Secondary | ICD-10-CM | POA: Diagnosis not present

## 2018-05-14 DIAGNOSIS — M25512 Pain in left shoulder: Secondary | ICD-10-CM | POA: Diagnosis not present

## 2018-05-20 DIAGNOSIS — M25512 Pain in left shoulder: Secondary | ICD-10-CM | POA: Diagnosis not present

## 2018-05-20 DIAGNOSIS — M25612 Stiffness of left shoulder, not elsewhere classified: Secondary | ICD-10-CM | POA: Diagnosis not present

## 2018-05-20 DIAGNOSIS — S42025D Nondisplaced fracture of shaft of left clavicle, subsequent encounter for fracture with routine healing: Secondary | ICD-10-CM | POA: Diagnosis not present

## 2018-05-28 DIAGNOSIS — S42025D Nondisplaced fracture of shaft of left clavicle, subsequent encounter for fracture with routine healing: Secondary | ICD-10-CM | POA: Diagnosis not present

## 2018-05-28 DIAGNOSIS — M25612 Stiffness of left shoulder, not elsewhere classified: Secondary | ICD-10-CM | POA: Diagnosis not present

## 2018-05-28 DIAGNOSIS — M25512 Pain in left shoulder: Secondary | ICD-10-CM | POA: Diagnosis not present

## 2018-06-04 DIAGNOSIS — M25612 Stiffness of left shoulder, not elsewhere classified: Secondary | ICD-10-CM | POA: Diagnosis not present

## 2018-06-04 DIAGNOSIS — M25512 Pain in left shoulder: Secondary | ICD-10-CM | POA: Diagnosis not present

## 2018-06-04 DIAGNOSIS — S42025D Nondisplaced fracture of shaft of left clavicle, subsequent encounter for fracture with routine healing: Secondary | ICD-10-CM | POA: Diagnosis not present

## 2018-06-06 DIAGNOSIS — M25512 Pain in left shoulder: Secondary | ICD-10-CM | POA: Diagnosis not present

## 2018-06-11 DIAGNOSIS — M25612 Stiffness of left shoulder, not elsewhere classified: Secondary | ICD-10-CM | POA: Diagnosis not present

## 2018-06-11 DIAGNOSIS — M25512 Pain in left shoulder: Secondary | ICD-10-CM | POA: Diagnosis not present

## 2018-06-11 DIAGNOSIS — S42025D Nondisplaced fracture of shaft of left clavicle, subsequent encounter for fracture with routine healing: Secondary | ICD-10-CM | POA: Diagnosis not present

## 2018-06-12 DIAGNOSIS — Z8679 Personal history of other diseases of the circulatory system: Secondary | ICD-10-CM | POA: Diagnosis not present

## 2018-06-12 DIAGNOSIS — Z9889 Other specified postprocedural states: Secondary | ICD-10-CM | POA: Diagnosis not present

## 2018-06-12 DIAGNOSIS — I480 Paroxysmal atrial fibrillation: Secondary | ICD-10-CM | POA: Diagnosis not present

## 2018-06-12 DIAGNOSIS — W19XXXA Unspecified fall, initial encounter: Secondary | ICD-10-CM | POA: Diagnosis not present

## 2018-06-25 DIAGNOSIS — Z1231 Encounter for screening mammogram for malignant neoplasm of breast: Secondary | ICD-10-CM | POA: Diagnosis not present

## 2018-06-25 DIAGNOSIS — Z853 Personal history of malignant neoplasm of breast: Secondary | ICD-10-CM | POA: Diagnosis not present

## 2018-07-07 DIAGNOSIS — M25512 Pain in left shoulder: Secondary | ICD-10-CM | POA: Diagnosis not present

## 2018-07-11 DIAGNOSIS — K1379 Other lesions of oral mucosa: Secondary | ICD-10-CM | POA: Diagnosis not present

## 2018-08-01 DIAGNOSIS — M2669 Other specified disorders of temporomandibular joint: Secondary | ICD-10-CM | POA: Diagnosis not present

## 2018-08-11 DIAGNOSIS — M810 Age-related osteoporosis without current pathological fracture: Secondary | ICD-10-CM | POA: Diagnosis not present

## 2018-08-27 DIAGNOSIS — H2513 Age-related nuclear cataract, bilateral: Secondary | ICD-10-CM | POA: Diagnosis not present

## 2018-08-27 DIAGNOSIS — H524 Presbyopia: Secondary | ICD-10-CM | POA: Diagnosis not present

## 2018-08-27 DIAGNOSIS — H35033 Hypertensive retinopathy, bilateral: Secondary | ICD-10-CM | POA: Diagnosis not present

## 2018-08-27 DIAGNOSIS — Z0100 Encounter for examination of eyes and vision without abnormal findings: Secondary | ICD-10-CM | POA: Diagnosis not present

## 2018-09-09 DIAGNOSIS — R69 Illness, unspecified: Secondary | ICD-10-CM | POA: Diagnosis not present

## 2018-10-07 ENCOUNTER — Encounter: Payer: Self-pay | Admitting: Internal Medicine

## 2018-10-24 ENCOUNTER — Ambulatory Visit: Payer: Medicare HMO | Admitting: Internal Medicine

## 2018-10-24 ENCOUNTER — Encounter: Payer: Self-pay | Admitting: Internal Medicine

## 2018-10-24 VITALS — BP 136/76 | HR 59 | Ht 62.0 in | Wt 138.0 lb

## 2018-10-24 DIAGNOSIS — I495 Sick sinus syndrome: Secondary | ICD-10-CM

## 2018-10-24 DIAGNOSIS — I4892 Unspecified atrial flutter: Secondary | ICD-10-CM | POA: Diagnosis not present

## 2018-10-24 DIAGNOSIS — I480 Paroxysmal atrial fibrillation: Secondary | ICD-10-CM

## 2018-10-24 NOTE — Patient Instructions (Addendum)

## 2018-10-24 NOTE — Progress Notes (Signed)
HPI Alexa Hansen returns today for ongoing evaluation of atrial fibrillation. She is a very pleasant 72 year old woman who developed atrial flutter and underwent catheter ablation. The patient during her procedure had spontaneous occurring atrial fibrillation. After discharge, she went in to atrial fibrillation within a few weeks, and has been placed on flecainide therapy. She has done well in the interim with no symptomatic atrial fibrillation. She does have some sinus node dysfunction but is asymptomatic. She remains active and denies chest pain or shortness of breath. Her dose of flecainide was reduced to 75 mg twice daily. No Known Allergies   Current Outpatient Medications  Medication Sig Dispense Refill  . apixaban (ELIQUIS) 5 MG TABS tablet Take 1 tablet (5 mg total) by mouth 2 (two) times daily. 60 tablet 3  . BIOTIN PO Take 4,000 mcg by mouth daily.     . Calcium Carbonate-Vitamin D (CALCIUM + D PO) Take 1 tablet by mouth 2 (two) times daily.     . flecainide (TAMBOCOR) 50 MG tablet Take 75 mg by mouth 2 (two) times daily.    Marland Kitchen lisinopril (PRINIVIL,ZESTRIL) 20 MG tablet Take 20 mg by mouth daily.  2  . metoprolol succinate (TOPROL-XL) 25 MG 24 hr tablet Take 25 mg by mouth daily.     No current facility-administered medications for this visit.      Past Medical History:  Diagnosis Date  . Atrial flutter ([place])   . Breast cancer ([place])   . Hypertension   . Syncope     ROS:   All systems reviewed and negative except as noted in the HPI.   Past Surgical History:  Procedure Laterality Date  . A-FLUTTER ABLATION N/A 06/12/2017   Procedure: A-Flutter Ablation;  Surgeon: Evans Lance, MD;  Location: [facility];  Service: Cardiovascular;  Laterality: N/A;  . BREAST SURGERY    . MOUTH SURGERY     to remove torus  . TONSILLECTOMY    . VEIN LIGATION     Dr Deon Pilling     Family History  Problem Relation Age of Onset  . Colon cancer Neg Hx      Social  History   Socioeconomic History  . Marital status: Married    Spouse name: Not on file  . Number of children: Not on file  . Years of education: Not on file  . Highest education level: Not on file  Occupational History  . Not on file  Social Needs  . Financial resource strain: Not on file  . Food insecurity:    Worry: Not on file    Inability: Not on file  . Transportation needs:    Medical: Not on file    Non-medical: Not on file  Tobacco Use  . Smoking status: Never Smoker  . Smokeless tobacco: Never Used  Substance and Sexual Activity  . Alcohol use: No  . Drug use: No  . Sexual activity: Never  Lifestyle  . Physical activity:    Days per week: Not on file    Minutes per session: Not on file  . Stress: Not on file  Relationships  . Social connections:    Talks on phone: Not on file    Gets together: Not on file    Attends religious service: Not on file    Active member of club or organization: Not on file    Attends meetings of clubs or organizations: Not on file    Relationship status: Not  on file  . Intimate partner violence:    Fear of current or ex partner: Not on file    Emotionally abused: Not on file    Physically abused: Not on file    Forced sexual activity: Not on file  Other Topics Concern  . Not on file  Social History Narrative  . Not on file     BP 136/76   Pulse (!) 59   Ht 5\' 2"  (1.575 m)   Wt 138 lb (62.6 kg)   BMI 25.24 kg/m   Physical Exam:  Well appearing NAD HEENT: Unremarkable Neck:  No JVD, no thyromegally Lymphatics:  No adenopathy Back:  No CVA tenderness Lungs:  Clear with no wheezes HEART:  Regular rate rhythm, no murmurs, no rubs, no clicks Abd:  soft, positive bowel sounds, no organomegally, no rebound, no guarding Ext:  2 plus pulses, no edema, no cyanosis, no clubbing Skin:  No rashes no nodules Neuro:  CN II through XII intact, motor grossly intact  EKG - nsr   Assess/Plan: 1. Atrial fib - she is maintaining  NSR on 75 bid of flecainide. We had a long discussion regarding her staying on Eliquis. I recommended she continue Eliquis as her AA drugs do not cure the atrial fib.  2. Atrial flutter - she has not had recurrent typical atrial flutter 3. Sinus node dysfunction - her rates are good today and she has not had any symptoms.  4. Disp. - I will see her back as needed. She would like to followup with Dr. Einar Gip exclusively. I am available if she would like to return to see me.   Mikle [email or web address].D.

## 2018-11-17 DIAGNOSIS — I480 Paroxysmal atrial fibrillation: Secondary | ICD-10-CM | POA: Diagnosis not present

## 2018-11-17 DIAGNOSIS — I10 Essential (primary) hypertension: Secondary | ICD-10-CM | POA: Diagnosis not present

## 2018-11-17 DIAGNOSIS — Z8679 Personal history of other diseases of the circulatory system: Secondary | ICD-10-CM | POA: Diagnosis not present

## 2018-11-17 DIAGNOSIS — Z9889 Other specified postprocedural states: Secondary | ICD-10-CM | POA: Diagnosis not present

## 2018-12-18 DIAGNOSIS — R69 Illness, unspecified: Secondary | ICD-10-CM | POA: Diagnosis not present

## 2018-12-26 DIAGNOSIS — M2629 Other anomalies of dental arch relationship: Secondary | ICD-10-CM | POA: Diagnosis not present

## 2019-02-26 DIAGNOSIS — M810 Age-related osteoporosis without current pathological fracture: Secondary | ICD-10-CM | POA: Diagnosis not present

## 2019-02-26 DIAGNOSIS — Z79899 Other long term (current) drug therapy: Secondary | ICD-10-CM | POA: Diagnosis not present

## 2019-02-26 DIAGNOSIS — D649 Anemia, unspecified: Secondary | ICD-10-CM | POA: Diagnosis not present

## 2019-03-02 DIAGNOSIS — Z0000 Encounter for general adult medical examination without abnormal findings: Secondary | ICD-10-CM | POA: Diagnosis not present

## 2019-03-02 DIAGNOSIS — Z853 Personal history of malignant neoplasm of breast: Secondary | ICD-10-CM | POA: Diagnosis not present

## 2019-03-02 DIAGNOSIS — Z1389 Encounter for screening for other disorder: Secondary | ICD-10-CM | POA: Diagnosis not present

## 2019-03-02 DIAGNOSIS — I10 Essential (primary) hypertension: Secondary | ICD-10-CM | POA: Diagnosis not present

## 2019-03-02 DIAGNOSIS — Z79899 Other long term (current) drug therapy: Secondary | ICD-10-CM | POA: Diagnosis not present

## 2019-03-02 DIAGNOSIS — M810 Age-related osteoporosis without current pathological fracture: Secondary | ICD-10-CM | POA: Diagnosis not present

## 2019-03-02 DIAGNOSIS — I4891 Unspecified atrial fibrillation: Secondary | ICD-10-CM | POA: Diagnosis not present

## 2019-03-02 DIAGNOSIS — Z7901 Long term (current) use of anticoagulants: Secondary | ICD-10-CM | POA: Diagnosis not present

## 2019-03-10 ENCOUNTER — Other Ambulatory Visit: Payer: Self-pay | Admitting: Cardiology

## 2019-04-02 ENCOUNTER — Other Ambulatory Visit: Payer: Self-pay | Admitting: Cardiology

## 2019-04-02 ENCOUNTER — Telehealth: Payer: Self-pay

## 2019-04-02 DIAGNOSIS — I480 Paroxysmal atrial fibrillation: Secondary | ICD-10-CM

## 2019-04-02 MED ORDER — APIXABAN 5 MG PO TABS: 5.0000 mg | ORAL_TABLET | Freq: Two times a day (BID) | ORAL | 2 refills | Status: DC

## 2019-04-06 ENCOUNTER — Other Ambulatory Visit: Payer: Self-pay | Admitting: Cardiology

## 2019-04-06 DIAGNOSIS — I480 Paroxysmal atrial fibrillation: Secondary | ICD-10-CM

## 2019-04-06 MED ORDER — APIXABAN 5 MG PO TABS: 5.0000 mg | ORAL_TABLET | Freq: Two times a day (BID) | ORAL | 2 refills | Status: DC

## 2019-04-09 NOTE — Telephone Encounter (Signed)
Error

## 2019-05-12 DIAGNOSIS — L570 Actinic keratosis: Secondary | ICD-10-CM | POA: Diagnosis not present

## 2019-05-12 DIAGNOSIS — L821 Other seborrheic keratosis: Secondary | ICD-10-CM | POA: Diagnosis not present

## 2019-05-12 DIAGNOSIS — D225 Melanocytic nevi of trunk: Secondary | ICD-10-CM | POA: Diagnosis not present

## 2019-05-12 DIAGNOSIS — D1801 Hemangioma of skin and subcutaneous tissue: Secondary | ICD-10-CM | POA: Diagnosis not present

## 2019-05-12 DIAGNOSIS — L814 Other melanin hyperpigmentation: Secondary | ICD-10-CM | POA: Diagnosis not present

## 2019-05-12 DIAGNOSIS — L718 Other rosacea: Secondary | ICD-10-CM | POA: Diagnosis not present

## 2019-05-18 ENCOUNTER — Other Ambulatory Visit: Payer: Self-pay

## 2019-05-18 ENCOUNTER — Encounter: Payer: Self-pay | Admitting: Cardiology

## 2019-05-18 ENCOUNTER — Ambulatory Visit (INDEPENDENT_AMBULATORY_CARE_PROVIDER_SITE_OTHER): Payer: Medicare HMO | Admitting: Cardiology

## 2019-05-18 VITALS — BP 133/76 | HR 54 | Ht 62.0 in | Wt 137.0 lb

## 2019-05-18 DIAGNOSIS — I10 Essential (primary) hypertension: Secondary | ICD-10-CM

## 2019-05-18 DIAGNOSIS — Z9889 Other specified postprocedural states: Secondary | ICD-10-CM

## 2019-05-18 DIAGNOSIS — I480 Paroxysmal atrial fibrillation: Secondary | ICD-10-CM | POA: Diagnosis not present

## 2019-05-18 DIAGNOSIS — Z8679 Personal history of other diseases of the circulatory system: Secondary | ICD-10-CM

## 2019-05-18 MED ORDER — METOPROLOL SUCCINATE ER 25 MG PO TB24: 25.0000 mg | ORAL_TABLET | Freq: Every day | ORAL | 3 refills | Status: DC

## 2019-05-18 MED ORDER — APIXABAN 5 MG PO TABS: 5.0000 mg | ORAL_TABLET | Freq: Two times a day (BID) | ORAL | 3 refills | Status: DC

## 2019-05-18 MED ORDER — LISINOPRIL 20 MG PO TABS: 20.0000 mg | ORAL_TABLET | Freq: Every day | ORAL | 1 refills | Status: DC

## 2019-05-18 NOTE — Progress Notes (Signed)
Virtual Visit via Video Note: This visit type was conducted due to national recommendations for restrictions regarding the COVID-19 Pandemic (e.g. social distancing).  This format is felt to be most appropriate for this patient at this time.  All issues noted in this document were discussed and addressed.  No physical exam was performed (except for noted visual exam findings with Telehealth visits).  The patient has consented to conduct a Telehealth visit and understands insurance will be billed.   I connected with@, on 05/18/19 at  by a video enabled telemedicine application and verified that I am speaking with the correct person using two identifiers.   I discussed the limitations of evaluation and management by telemedicine and the availability of in person appointments. The patient expressed understanding and agreed to proceed.   I have discussed with patient regarding the safety during COVID Pandemic and steps and precautions to be taken including social distancing, frequent hand wash and use of detergent soap, gels with the patient. I asked the patient to avoid touching mouth, nose, eyes, ears with the hands. I encouraged regular walking around the neighborhood and exercise and regular diet, as long as social distancing can be maintained.  Primary Physician/Referring:  Leighton Ruff, MD  Patient ID: Alexa Hansen, female    DOB: 14-Aug-1946, 73 y.o.   MRN: [id number]  Chief Complaint  Patient presents with  . Atrial Flutter  . Follow-up    HPI: Alexa Hansen  is a 73 y.o. female  with  paroxysmal atrial flutter status post RFA 05/2017 by Dr. Lovena Le, had developed A. Fib during A. Flutter ablation by Dr. Lovena Le and hence on long term anticoagulation.  She saw Dr. [name], 10/24/18, to continue anticoagulation for now. Presents for 6 month f/u and requests refills. No bleeding diathesis on Eliquis. Treadmill stress on Flecainide did not reveal any significant arrhythmias.   She is  presently doing well, tolerating anticoagulation with Eliquis without bleeding diathesis.  States that she had complete physical and also labs done few weeks ago and told to be completely normal.  Denies palpitations, dizziness or syncope.  Past Medical History:  Diagnosis Date  . Atrial flutter ([place])   . Breast cancer ([place])   . Hypertension   . Syncope     Past Surgical History:  Procedure Laterality Date  . A-FLUTTER ABLATION N/A 06/12/2017   Procedure: A-Flutter Ablation;  Surgeon: Evans Lance, MD;  Location: [facility];  Service: Cardiovascular;  Laterality: N/A;  . BREAST SURGERY    . MOUTH SURGERY     to remove torus  . TONSILLECTOMY    . VEIN LIGATION     Dr Deon Pilling    Social History   Socioeconomic History  . Marital status: Married    Spouse name: Not on file  . Number of children: 2  . Years of education: Not on file  . Highest education level: Not on file  Occupational History  . Not on file  Social Needs  . Financial resource strain: Not on file  . Food insecurity:    Worry: Not on file    Inability: Not on file  . Transportation needs:    Medical: Not on file    Non-medical: Not on file  Tobacco Use  . Smoking status: Never Smoker  . Smokeless tobacco: Never Used  Substance and Sexual Activity  . Alcohol use: No  . Drug use: No  . Sexual activity: Never  Lifestyle  . Physical  activity:    Days per week: Not on file    Minutes per session: Not on file  . Stress: Not on file  Relationships  . Social connections:    Talks on phone: Not on file    Gets together: Not on file    Attends religious service: Not on file    Active member of club or organization: Not on file    Attends meetings of clubs or organizations: Not on file    Relationship status: Not on file  . Intimate partner violence:    Fear of current or ex partner: Not on file    Emotionally abused: Not on file    Physically abused: Not on file    Forced sexual activity: Not  on file  Other Topics Concern  . Not on file  Social History Narrative  . Not on file    Review of Systems  Constitution: Negative for chills, decreased appetite, malaise/fatigue and weight gain.  Cardiovascular: Negative for dyspnea on exertion, leg swelling and syncope.  Endocrine: Negative for cold intolerance.  Hematologic/Lymphatic: Does not bruise/bleed easily.  Musculoskeletal: Positive for joint pain. Negative for joint swelling.  Gastrointestinal: Negative for abdominal pain, anorexia, change in bowel habit, hematochezia and melena.  Neurological: Negative for headaches and light-headedness.  Psychiatric/Behavioral: Negative for depression and substance abuse.  All other systems reviewed and are negative.     Objective  Blood pressure 133/76, pulse (!) 54, height _0  (1.575 m), weight 137 lb (62.1 kg). Body mass index is 25.06 kg/m.    Physical exam not performed or limited due to virtual visit.  Patient appeared to be in no distress, Neck was supple, respiration was not labored.  Please see exam details from prior visit is as below.  Physical Exam  Constitutional: She appears well-developed and well-nourished. No distress.  Appears younger than stated age  HENT:  Head: Atraumatic.  Eyes: Conjunctivae are normal.  Neck: Neck supple. No JVD present. No thyromegaly present.  Cardiovascular: Normal rate, regular rhythm, normal heart sounds and intact distal pulses. Exam reveals no gallop.  No murmur heard. Mild varicose veins noted right lower extremity  Pulmonary/Chest: Effort normal and breath sounds normal.  Abdominal: Soft. Bowel sounds are normal.  Musculoskeletal: Normal range of motion.  Neurological: She is alert.  Skin: Skin is warm and dry.  Psychiatric: She has a normal mood and affect.   Radiology: No results found.  Laboratory examination:    Labs 04/30/2018: Glucose 87.  BUN/creatinine 15/0.52.  EGFR normal.  Sodium 138, potassium 4.6.  Rest of  the CMP normal. H/H 11/33.  MCV 96.  Platelets 309.  Labs RBC 3.97, CBC otherwise normal.  Creatinine 0.64, EGFR 91/110, potassium 4.9, CMP normal.  Vitamin D 37.1.  Labs 06/04/2017: Potassium 4.5, BUN 14, creatinine 0.69, HB 11.5/HCT 34.9, platelets 257.  Normal indicis.  TSH normal.  Labs 12/06/2016: Serum glucose 105 mg, nonfasting.  Urine 13, serum creatinine 0.57, eGFR 94 mL.  It has some 4.7.  PRN Meds:. Medications Discontinued During This Encounter  Medication Reason  . apixaban (ELIQUIS) 5 MG TABS tablet Reorder  . lisinopril (PRINIVIL,ZESTRIL) 20 MG tablet Reorder  . metoprolol succinate (TOPROL-XL) 25 MG 24 hr tablet Reorder   Current Meds  Medication Sig  . apixaban (ELIQUIS) 5 MG TABS tablet Take 1 tablet (5 mg total) by mouth 2 (two) times daily.  Marland Kitchen BIOTIN PO Take 4,000 mcg by mouth daily.   . Calcium Carbonate-Vitamin D (CALCIUM +  D PO) Take 1 tablet by mouth 2 (two) times daily.   . flecainide (TAMBOCOR) 50 MG tablet Take 75 mg by mouth 2 (two) times daily.  Marland Kitchen lisinopril (ZESTRIL) 20 MG tablet Take 1 tablet (20 mg total) by mouth daily.  . metoprolol succinate (TOPROL-XL) 25 MG 24 hr tablet Take 1 tablet (25 mg total) by mouth daily.  . [DISCONTINUED] apixaban (ELIQUIS) 5 MG TABS tablet Take 1 tablet (5 mg total) by mouth 2 (two) times daily.  . [DISCONTINUED] lisinopril (PRINIVIL,ZESTRIL) 20 MG tablet Take 20 mg by mouth daily.  . [DISCONTINUED] metoprolol succinate (TOPROL-XL) 25 MG 24 hr tablet Take 25 mg by mouth daily.    Cardiac Studies:   Stress Report on Flecainide-Dr [name] 07/18/2017: Normal without inducible arrhythmias.  Echocardiogram [12/06/2016]: Left ventricle cavity is normal in size. Mild concentric hypertrophy of the left ventricle. Normal global wall motion. Doppler evidence of grade I (impaired) diastolic dysfunction, normal LAP. Calculated EF 67%. Left atrial cavity is moderately dilated AP diameter 4.3 cm, but appears larger. Mild  tricuspid regurgitation. No evidence of pulmonary hypertension.   Assessment   Paroxysmal atrial fibrillation (HCC) CHA2DS2-VASc Score is 3 with yearly risk of stroke of  3.2%. - Plan: apixaban (ELIQUIS) 5 MG TABS tablet, metoprolol succinate (TOPROL-XL) 25 MG 24 hr tablet  S/P ablation of atrial flutter 06/12/2017  Essential hypertension - Plan: lisinopril (ZESTRIL) 20 MG tablet  EKG 11/17/2018: Sinus bradycardia at rate of 57 bpm, borderline criteria for left atrial enlargement, otherwise normal EKG. No significant change from EKG 01/29/2018.  Recommendations:   Patient with paroxysmal atrial fibrillation, episode documented after atrial flutter ablation in June 2018.  Due to her cardioembolic risk, she has been recommended long-term anticoagulation.  I have reviewed with her again regarding the bleeding risk associated with Eliquis, to watch out for GI bleeding and any change in bowel movement.  Blood pressure is well controlled.  Her labs per patient are all within normal limits including her CBC and TSH and renal function.  I have refilled all of her medications.  Patient is very reliable, although 73 years of age, looks much younger and also fairly active.  I will see her back in the office in 6 months.  Adrian Prows, MD, [facility] 05/18/2019, 10:57 AM [facility] [place] Pager: [phone] Office: [phone] If no answer Cell [phone]

## 2019-07-10 DIAGNOSIS — Z853 Personal history of malignant neoplasm of breast: Secondary | ICD-10-CM | POA: Diagnosis not present

## 2019-07-10 DIAGNOSIS — Z1231 Encounter for screening mammogram for malignant neoplasm of breast: Secondary | ICD-10-CM | POA: Diagnosis not present

## 2019-07-21 DIAGNOSIS — R69 Illness, unspecified: Secondary | ICD-10-CM | POA: Diagnosis not present

## 2019-08-18 DIAGNOSIS — R69 Illness, unspecified: Secondary | ICD-10-CM | POA: Diagnosis not present

## 2019-08-31 DIAGNOSIS — H35033 Hypertensive retinopathy, bilateral: Secondary | ICD-10-CM | POA: Diagnosis not present

## 2019-08-31 DIAGNOSIS — H524 Presbyopia: Secondary | ICD-10-CM | POA: Diagnosis not present

## 2019-09-28 DIAGNOSIS — H40023 Open angle with borderline findings, high risk, bilateral: Secondary | ICD-10-CM | POA: Diagnosis not present

## 2019-10-06 DIAGNOSIS — Z0100 Encounter for examination of eyes and vision without abnormal findings: Secondary | ICD-10-CM | POA: Diagnosis not present

## 2019-10-07 ENCOUNTER — Other Ambulatory Visit: Payer: Self-pay

## 2019-10-07 DIAGNOSIS — I10 Essential (primary) hypertension: Secondary | ICD-10-CM

## 2019-10-07 DIAGNOSIS — I480 Paroxysmal atrial fibrillation: Secondary | ICD-10-CM

## 2019-10-07 MED ORDER — APIXABAN 5 MG PO TABS: 5.0000 mg | ORAL_TABLET | Freq: Two times a day (BID) | ORAL | 3 refills | Status: DC

## 2019-10-07 MED ORDER — METOPROLOL SUCCINATE ER 25 MG PO TB24: 25.0000 mg | ORAL_TABLET | Freq: Every day | ORAL | 3 refills | Status: DC

## 2019-10-07 MED ORDER — FLECAINIDE ACETATE 50 MG PO TABS: 75.0000 mg | ORAL_TABLET | Freq: Two times a day (BID) | ORAL | 3 refills | Status: DC

## 2019-10-07 MED ORDER — LISINOPRIL 20 MG PO TABS: 20.0000 mg | ORAL_TABLET | Freq: Every day | ORAL | 3 refills | Status: DC

## 2019-10-15 DIAGNOSIS — I10 Essential (primary) hypertension: Secondary | ICD-10-CM | POA: Diagnosis not present

## 2019-10-15 DIAGNOSIS — M810 Age-related osteoporosis without current pathological fracture: Secondary | ICD-10-CM | POA: Diagnosis not present

## 2019-10-15 DIAGNOSIS — Z853 Personal history of malignant neoplasm of breast: Secondary | ICD-10-CM | POA: Diagnosis not present

## 2019-10-15 DIAGNOSIS — I4891 Unspecified atrial fibrillation: Secondary | ICD-10-CM | POA: Diagnosis not present

## 2019-11-16 DIAGNOSIS — I4891 Unspecified atrial fibrillation: Secondary | ICD-10-CM | POA: Diagnosis not present

## 2019-11-16 DIAGNOSIS — M810 Age-related osteoporosis without current pathological fracture: Secondary | ICD-10-CM | POA: Diagnosis not present

## 2019-11-16 DIAGNOSIS — Z853 Personal history of malignant neoplasm of breast: Secondary | ICD-10-CM | POA: Diagnosis not present

## 2019-11-16 DIAGNOSIS — I10 Essential (primary) hypertension: Secondary | ICD-10-CM | POA: Diagnosis not present

## 2019-11-25 ENCOUNTER — Encounter: Payer: Self-pay | Admitting: Cardiology

## 2019-11-25 ENCOUNTER — Ambulatory Visit (INDEPENDENT_AMBULATORY_CARE_PROVIDER_SITE_OTHER): Payer: Medicare HMO | Admitting: Cardiology

## 2019-11-25 ENCOUNTER — Other Ambulatory Visit: Payer: Self-pay

## 2019-11-25 VITALS — BP 133/69 | HR 72 | Temp 97.2°F | Ht 63.0 in | Wt 137.0 lb

## 2019-11-25 DIAGNOSIS — I480 Paroxysmal atrial fibrillation: Secondary | ICD-10-CM | POA: Diagnosis not present

## 2019-11-25 DIAGNOSIS — I10 Essential (primary) hypertension: Secondary | ICD-10-CM | POA: Diagnosis not present

## 2019-11-25 NOTE — Progress Notes (Signed)
Primary Physician/Referring:  Leighton Ruff, MD  Patient ID: Alexa Hansen, female    DOB: 19-Dec-1945, 73 y.o.   MRN: [id number]  Chief Complaint  Patient presents with  . Atrial Fibrillation  . Hypertension  . Follow-up    72mo   HPI: Alexa Hansen is a 73y.o. female  with  paroxysmal atrial flutter status post RFA 05/2017 by Dr. TLovena Le had developed A. Fib during A. Flutter ablation by Dr. TLovena Leand hence on long term anticoagulation.  She saw Dr. GCristopher Peru 10/24/18, to continue anticoagulation for now due to high cardioembolic risk.  She is presently doing well, tolerating anticoagulation with Eliquis without bleeding diathesis.  States that she had complete physical and also labs done in March 2020 told to be completely normal.  Denies palpitations, dizziness or syncope.   Past Medical History:  Diagnosis Date  . [place]   . [place] cancer ([place]   . Hypertension   . Syncope     Past Surgical History:  Procedure Laterality Date  . A-FLUTTER ABLATION N/A 06/12/2017   Procedure: A-Flutter Ablation;  Surgeon: TEvans Lance MD;  [facility];  Service: Cardiovascular;  Laterality: N/A;  . BREAST SURGERY    . MOUTH SURGERY     to remove torus  . TONSILLECTOMY    . VEIN LIGATION     Dr BDeon Pilling   Social History   Socioeconomic History  . Marital status: Married    Spouse name: Not on file  . Number of children: 2  . Years of education: Not on file  . Highest education level: Not on file  Occupational History  . Not on file  Social Needs  . Financial resource strain: Not on file  . Food insecurity    Worry: Not on file    Inability: Not on file  . Transportation needs    Medical: Not on file    Non-medical: Not on file  Tobacco Use  . Smoking status: Never Smoker  . Smokeless tobacco: Never Used  Substance and Sexual Activity  . Alcohol use: No  . Drug use: No  . Sexual activity: Never  Lifestyle  . Physical  activity    Days per week: Not on file    Minutes per session: Not on file  . Stress: Not on file  Relationships  . Social [place]: Not on file    Gets together: Not on file    Attends religious service: Not on file    Active member of club or organization: Not on file    Attends meetings of clubs or organizations: Not on file    Relationship status: Not on file  . Intimate partner violence    Fear of current or ex partner: Not on file    Emotionally abused: Not on file    Physically abused: Not on file    Forced sexual activity: Not on file  Other Topics Concern  . Not on file  Social History Narrative  . Not on file    Review of Systems  Constitution: Negative for chills, decreased appetite, malaise/fatigue and weight gain.  Cardiovascular: Negative for dyspnea on exertion, leg swelling and syncope.  Endocrine: Negative for cold intolerance.  Hematologic/Lymphatic: Does not bruise/bleed easily.  Musculoskeletal: Positive for joint pain. Negative for joint swelling.  Gastrointestinal: Negative for abdominal pain, anorexia, change in bowel habit, hematochezia and melena.  Neurological: Negative for  headaches and light-headedness.  Psychiatric/Behavioral: Negative for depression and substance abuse.  All other systems reviewed and are negative.     Objective  Blood pressure 133/69, pulse 72, temperature (!) 97.2 F (36.2 C), height _0  (1.6 m), weight 137 lb (62.1 kg), SpO2 100 %. Body mass index is 24.27 kg/m.    Physical Exam  Constitutional: She appears well-developed and well-nourished. No distress.  Appears younger than stated age  HENT:  Head: Atraumatic.  Eyes: Conjunctivae are normal.  Neck: Neck supple. No JVD present. No thyromegaly present.  Cardiovascular: Normal rate, regular rhythm, normal heart sounds and intact distal pulses. Exam reveals no gallop.  No murmur heard. Mild varicose veins noted right lower extremity   Pulmonary/Chest: Effort normal and breath sounds normal.  Abdominal: Soft. Bowel sounds are normal.  Musculoskeletal: Normal range of motion.  Neurological: She is alert.  Skin: Skin is warm and dry.  Psychiatric: She has a normal mood and affect.   Radiology: No results found.  Laboratory examination:    Labs 04/30/2018: Glucose 87.  BUN/creatinine 15/0.52.  EGFR normal.  Sodium 138, potassium 4.6.  Rest of the CMP normal. H/H 11/33.  MCV 96.  Platelets 309.  Labs RBC 3.97, CBC otherwise normal.  Creatinine 0.64, EGFR 91/110, potassium 4.9, CMP normal.  Vitamin D 37.1.  Labs 06/04/2017: Potassium 4.5, BUN 14, creatinine 0.69, HB 11.5/HCT 34.9, platelets 257.  Normal indicis.  TSH normal.  Labs 12/06/2016: Serum glucose 105 mg, nonfasting.  Urine 13, serum creatinine 0.57, eGFR 94 mL.  It has some 4.7.  PRN Meds:. There are no discontinued medications. Current Meds  Medication Sig  . apixaban (ELIQUIS) 5 MG TABS tablet Take 1 tablet (5 mg total) by mouth 2 (two) times daily.  Marland Kitchen BIOTIN PO Take 2,000 mcg by mouth daily.   . Calcium Carbonate-Vitamin D (CALCIUM + D PO) Take 1 tablet by mouth 2 (two) times daily.   . flecainide (TAMBOCOR) 50 MG tablet Take 1.5 tablets (75 mg total) by mouth 2 (two) times daily.  Marland Kitchen lisinopril (ZESTRIL) 20 MG tablet Take 1 tablet (20 mg total) by mouth daily. (Patient taking differently: Take 20 mg by mouth at bedtime. )  . metoprolol succinate (TOPROL-XL) 25 MG 24 hr tablet Take 1 tablet (25 mg total) by mouth daily. (Patient taking differently: Take 25 mg by mouth at bedtime. )    Cardiac Studies:   Stress Report on Flecainide-Dr [name] 07/18/2017: Normal without inducible arrhythmias.  Echocardiogram [12/06/2016]: Left ventricle cavity is normal in size. Mild concentric hypertrophy of the left ventricle. Normal global wall motion. Doppler evidence of grade I (impaired) diastolic dysfunction, normal LAP. Calculated EF 67%. Left atrial  cavity is moderately dilated AP diameter 4.3 cm, but appears larger. Mild tricuspid regurgitation. No evidence of pulmonary hypertension.   Assessment     ICD-10-CM   1. Paroxysmal atrial fibrillation (HCC) CHA2DS2-VASc Score is 3 with yearly risk of stroke of  3.2%.  I48.0 EKG 12-Lead  2. Essential hypertension  I10     EKG 11/25/2019: Normal sinus rhythm at rate of 69 bpm, left atrial enlargement, normal axis.  Otherwise normal EKG. No significant change from  EKG 11/17/2018: Sinus bradycardia at rate of 57 bpm.  Recommendations:   Patient with paroxysmal atrial fibrillation, episode documented after atrial flutter ablation in June 2018.  Due to her cardioembolic risk, she has been recommended long-term anticoagulation.  I have reviewed with her again regarding the bleeding risk associated with Eliquis,  to watch out for GI bleeding and any change in bowel movement.  Blood pressure is well controlled.  Patient is very reliable, although 73 years of age, looks much younger and also fairly active.  I will see her back in the office in 1 year.  Adrian Prows, MD, [facility] 11/25/2019, 2:29 PM [facility] [place] Pager: [phone] Office: [phone]  If no answer Cell [phone]

## 2019-12-08 DIAGNOSIS — I4891 Unspecified atrial fibrillation: Secondary | ICD-10-CM | POA: Diagnosis not present

## 2019-12-08 DIAGNOSIS — I10 Essential (primary) hypertension: Secondary | ICD-10-CM | POA: Diagnosis not present

## 2019-12-08 DIAGNOSIS — Z853 Personal history of malignant neoplasm of breast: Secondary | ICD-10-CM | POA: Diagnosis not present

## 2019-12-08 DIAGNOSIS — M810 Age-related osteoporosis without current pathological fracture: Secondary | ICD-10-CM | POA: Diagnosis not present

## 2019-12-17 DIAGNOSIS — I4891 Unspecified atrial fibrillation: Secondary | ICD-10-CM | POA: Diagnosis not present

## 2019-12-17 DIAGNOSIS — Z853 Personal history of malignant neoplasm of breast: Secondary | ICD-10-CM | POA: Diagnosis not present

## 2019-12-17 DIAGNOSIS — M810 Age-related osteoporosis without current pathological fracture: Secondary | ICD-10-CM | POA: Diagnosis not present

## 2019-12-17 DIAGNOSIS — I10 Essential (primary) hypertension: Secondary | ICD-10-CM | POA: Diagnosis not present

## 2019-12-22 DIAGNOSIS — R69 Illness, unspecified: Secondary | ICD-10-CM | POA: Diagnosis not present

## 2020-01-11 DIAGNOSIS — I10 Essential (primary) hypertension: Secondary | ICD-10-CM | POA: Diagnosis not present

## 2020-01-11 DIAGNOSIS — Z853 Personal history of malignant neoplasm of breast: Secondary | ICD-10-CM | POA: Diagnosis not present

## 2020-01-11 DIAGNOSIS — I4891 Unspecified atrial fibrillation: Secondary | ICD-10-CM | POA: Diagnosis not present

## 2020-01-11 DIAGNOSIS — M810 Age-related osteoporosis without current pathological fracture: Secondary | ICD-10-CM | POA: Diagnosis not present

## 2020-01-13 DIAGNOSIS — M810 Age-related osteoporosis without current pathological fracture: Secondary | ICD-10-CM | POA: Diagnosis not present

## 2020-01-13 DIAGNOSIS — M85852 Other specified disorders of bone density and structure, left thigh: Secondary | ICD-10-CM | POA: Diagnosis not present

## 2020-01-19 DIAGNOSIS — I10 Essential (primary) hypertension: Secondary | ICD-10-CM | POA: Diagnosis not present

## 2020-01-19 DIAGNOSIS — M810 Age-related osteoporosis without current pathological fracture: Secondary | ICD-10-CM | POA: Diagnosis not present

## 2020-01-19 DIAGNOSIS — I4891 Unspecified atrial fibrillation: Secondary | ICD-10-CM | POA: Diagnosis not present

## 2020-01-19 DIAGNOSIS — Z853 Personal history of malignant neoplasm of breast: Secondary | ICD-10-CM | POA: Diagnosis not present

## 2020-01-20 ENCOUNTER — Telehealth: Payer: Self-pay

## 2020-01-21 NOTE — Telephone Encounter (Signed)
Yes please Please do

## 2020-01-26 DIAGNOSIS — L819 Disorder of pigmentation, unspecified: Secondary | ICD-10-CM | POA: Diagnosis not present

## 2020-02-19 DIAGNOSIS — I4891 Unspecified atrial fibrillation: Secondary | ICD-10-CM | POA: Diagnosis not present

## 2020-02-19 DIAGNOSIS — M810 Age-related osteoporosis without current pathological fracture: Secondary | ICD-10-CM | POA: Diagnosis not present

## 2020-02-19 DIAGNOSIS — I10 Essential (primary) hypertension: Secondary | ICD-10-CM | POA: Diagnosis not present

## 2020-02-19 DIAGNOSIS — Z853 Personal history of malignant neoplasm of breast: Secondary | ICD-10-CM | POA: Diagnosis not present

## 2020-03-09 DIAGNOSIS — Z0000 Encounter for general adult medical examination without abnormal findings: Secondary | ICD-10-CM | POA: Diagnosis not present

## 2020-03-09 DIAGNOSIS — Z853 Personal history of malignant neoplasm of breast: Secondary | ICD-10-CM | POA: Diagnosis not present

## 2020-03-09 DIAGNOSIS — M810 Age-related osteoporosis without current pathological fracture: Secondary | ICD-10-CM | POA: Diagnosis not present

## 2020-03-09 DIAGNOSIS — I10 Essential (primary) hypertension: Secondary | ICD-10-CM | POA: Diagnosis not present

## 2020-03-09 DIAGNOSIS — I4891 Unspecified atrial fibrillation: Secondary | ICD-10-CM | POA: Diagnosis not present

## 2020-03-09 DIAGNOSIS — Z79899 Other long term (current) drug therapy: Secondary | ICD-10-CM | POA: Diagnosis not present

## 2020-03-09 DIAGNOSIS — Z7901 Long term (current) use of anticoagulants: Secondary | ICD-10-CM | POA: Diagnosis not present

## 2020-03-14 DIAGNOSIS — Z7901 Long term (current) use of anticoagulants: Secondary | ICD-10-CM | POA: Diagnosis not present

## 2020-03-14 DIAGNOSIS — M810 Age-related osteoporosis without current pathological fracture: Secondary | ICD-10-CM | POA: Diagnosis not present

## 2020-03-14 DIAGNOSIS — D6869 Other thrombophilia: Secondary | ICD-10-CM | POA: Diagnosis not present

## 2020-03-14 DIAGNOSIS — I10 Essential (primary) hypertension: Secondary | ICD-10-CM | POA: Diagnosis not present

## 2020-03-14 DIAGNOSIS — Z0000 Encounter for general adult medical examination without abnormal findings: Secondary | ICD-10-CM | POA: Diagnosis not present

## 2020-03-14 DIAGNOSIS — Z79899 Other long term (current) drug therapy: Secondary | ICD-10-CM | POA: Diagnosis not present

## 2020-03-14 DIAGNOSIS — I4891 Unspecified atrial fibrillation: Secondary | ICD-10-CM | POA: Diagnosis not present

## 2020-03-14 DIAGNOSIS — Z853 Personal history of malignant neoplasm of breast: Secondary | ICD-10-CM | POA: Diagnosis not present

## 2020-03-15 ENCOUNTER — Encounter: Payer: Self-pay | Admitting: Cardiology

## 2020-03-23 DIAGNOSIS — R69 Illness, unspecified: Secondary | ICD-10-CM | POA: Diagnosis not present

## 2020-04-01 DIAGNOSIS — I10 Essential (primary) hypertension: Secondary | ICD-10-CM | POA: Diagnosis not present

## 2020-04-01 DIAGNOSIS — I4891 Unspecified atrial fibrillation: Secondary | ICD-10-CM | POA: Diagnosis not present

## 2020-04-01 DIAGNOSIS — Z853 Personal history of malignant neoplasm of breast: Secondary | ICD-10-CM | POA: Diagnosis not present

## 2020-04-01 DIAGNOSIS — M810 Age-related osteoporosis without current pathological fracture: Secondary | ICD-10-CM | POA: Diagnosis not present

## 2020-04-19 DIAGNOSIS — Z853 Personal history of malignant neoplasm of breast: Secondary | ICD-10-CM | POA: Diagnosis not present

## 2020-04-19 DIAGNOSIS — I10 Essential (primary) hypertension: Secondary | ICD-10-CM | POA: Diagnosis not present

## 2020-04-19 DIAGNOSIS — M810 Age-related osteoporosis without current pathological fracture: Secondary | ICD-10-CM | POA: Diagnosis not present

## 2020-04-19 DIAGNOSIS — I4891 Unspecified atrial fibrillation: Secondary | ICD-10-CM | POA: Diagnosis not present

## 2020-06-14 DIAGNOSIS — M810 Age-related osteoporosis without current pathological fracture: Secondary | ICD-10-CM | POA: Diagnosis not present

## 2020-06-14 DIAGNOSIS — Z853 Personal history of malignant neoplasm of breast: Secondary | ICD-10-CM | POA: Diagnosis not present

## 2020-06-14 DIAGNOSIS — I10 Essential (primary) hypertension: Secondary | ICD-10-CM | POA: Diagnosis not present

## 2020-06-14 DIAGNOSIS — I4891 Unspecified atrial fibrillation: Secondary | ICD-10-CM | POA: Diagnosis not present

## 2020-07-06 DIAGNOSIS — D225 Melanocytic nevi of trunk: Secondary | ICD-10-CM | POA: Diagnosis not present

## 2020-07-06 DIAGNOSIS — L821 Other seborrheic keratosis: Secondary | ICD-10-CM | POA: Diagnosis not present

## 2020-07-06 DIAGNOSIS — L570 Actinic keratosis: Secondary | ICD-10-CM | POA: Diagnosis not present

## 2020-07-06 DIAGNOSIS — D1801 Hemangioma of skin and subcutaneous tissue: Secondary | ICD-10-CM | POA: Diagnosis not present

## 2020-07-06 DIAGNOSIS — L718 Other rosacea: Secondary | ICD-10-CM | POA: Diagnosis not present

## 2020-07-06 DIAGNOSIS — L708 Other acne: Secondary | ICD-10-CM | POA: Diagnosis not present

## 2020-07-14 DIAGNOSIS — Z1231 Encounter for screening mammogram for malignant neoplasm of breast: Secondary | ICD-10-CM | POA: Diagnosis not present

## 2020-07-18 DIAGNOSIS — I4891 Unspecified atrial fibrillation: Secondary | ICD-10-CM | POA: Diagnosis not present

## 2020-07-18 DIAGNOSIS — Z853 Personal history of malignant neoplasm of breast: Secondary | ICD-10-CM | POA: Diagnosis not present

## 2020-07-18 DIAGNOSIS — I10 Essential (primary) hypertension: Secondary | ICD-10-CM | POA: Diagnosis not present

## 2020-07-18 DIAGNOSIS — M810 Age-related osteoporosis without current pathological fracture: Secondary | ICD-10-CM | POA: Diagnosis not present

## 2020-08-17 DIAGNOSIS — Z853 Personal history of malignant neoplasm of breast: Secondary | ICD-10-CM | POA: Diagnosis not present

## 2020-08-17 DIAGNOSIS — I4891 Unspecified atrial fibrillation: Secondary | ICD-10-CM | POA: Diagnosis not present

## 2020-08-17 DIAGNOSIS — M810 Age-related osteoporosis without current pathological fracture: Secondary | ICD-10-CM | POA: Diagnosis not present

## 2020-08-17 DIAGNOSIS — I10 Essential (primary) hypertension: Secondary | ICD-10-CM | POA: Diagnosis not present

## 2020-09-14 DIAGNOSIS — R69 Illness, unspecified: Secondary | ICD-10-CM | POA: Diagnosis not present

## 2020-09-19 DIAGNOSIS — H524 Presbyopia: Secondary | ICD-10-CM | POA: Diagnosis not present

## 2020-09-19 DIAGNOSIS — H35033 Hypertensive retinopathy, bilateral: Secondary | ICD-10-CM | POA: Diagnosis not present

## 2020-09-29 DIAGNOSIS — Z0100 Encounter for examination of eyes and vision without abnormal findings: Secondary | ICD-10-CM | POA: Diagnosis not present

## 2020-09-29 DIAGNOSIS — R69 Illness, unspecified: Secondary | ICD-10-CM | POA: Diagnosis not present

## 2020-10-03 DIAGNOSIS — I4891 Unspecified atrial fibrillation: Secondary | ICD-10-CM | POA: Diagnosis not present

## 2020-10-03 DIAGNOSIS — I10 Essential (primary) hypertension: Secondary | ICD-10-CM | POA: Diagnosis not present

## 2020-10-09 ENCOUNTER — Other Ambulatory Visit: Payer: Self-pay | Admitting: Cardiology

## 2020-10-09 DIAGNOSIS — I480 Paroxysmal atrial fibrillation: Secondary | ICD-10-CM

## 2020-10-10 NOTE — Telephone Encounter (Signed)
Refill request

## 2020-11-09 DIAGNOSIS — Z853 Personal history of malignant neoplasm of breast: Secondary | ICD-10-CM | POA: Diagnosis not present

## 2020-11-09 DIAGNOSIS — I10 Essential (primary) hypertension: Secondary | ICD-10-CM | POA: Diagnosis not present

## 2020-11-09 DIAGNOSIS — M810 Age-related osteoporosis without current pathological fracture: Secondary | ICD-10-CM | POA: Diagnosis not present

## 2020-11-09 DIAGNOSIS — I4891 Unspecified atrial fibrillation: Secondary | ICD-10-CM | POA: Diagnosis not present

## 2020-11-24 ENCOUNTER — Ambulatory Visit: Payer: Medicare HMO | Admitting: Cardiology

## 2020-11-28 ENCOUNTER — Ambulatory Visit: Payer: Medicare HMO | Admitting: Cardiology

## 2020-11-28 ENCOUNTER — Other Ambulatory Visit: Payer: Self-pay

## 2020-11-28 ENCOUNTER — Encounter: Payer: Self-pay | Admitting: Cardiology

## 2020-11-28 VITALS — BP 154/76 | HR 63 | Resp 16 | Ht 63.0 in | Wt 136.0 lb

## 2020-11-28 DIAGNOSIS — Z8679 Personal history of other diseases of the circulatory system: Secondary | ICD-10-CM

## 2020-11-28 DIAGNOSIS — I10 Essential (primary) hypertension: Secondary | ICD-10-CM | POA: Diagnosis not present

## 2020-11-28 DIAGNOSIS — I480 Paroxysmal atrial fibrillation: Secondary | ICD-10-CM

## 2020-11-28 DIAGNOSIS — Z9889 Other specified postprocedural states: Secondary | ICD-10-CM | POA: Diagnosis not present

## 2020-11-28 NOTE — Progress Notes (Signed)
Primary Physician/Referring:  Leighton Ruff, MD  Patient ID: Alexa Hansen, female    DOB: 11/15/1946, 74 y.o.   MRN: [id number]  Chief Complaint  Patient presents with  . Follow-up  . Atrial Fibrillation    1 year    HPI: Alexa Hansen  is a 74 y.o. female  with  paroxysmal atrial flutter status post RFA 05/2017 by Dr. Lovena Le, had developed A. Fib during A. Flutter ablation by Dr. Lovena Le and hence on long term anticoagulation.  She saw Dr. [name], 10/24/18, to continue anticoagulation for now due to high cardioembolic risk.  She is presently doing well, tolerating anticoagulation with Eliquis without bleeding diathesis. She is essentially asymptomatic. Past Medical History:  Diagnosis Date  . Atrial flutter ([place])   . Breast cancer ([place])   . Hypertension   . Syncope     Past Surgical History:  Procedure Laterality Date  . A-FLUTTER ABLATION N/A 06/12/2017   Procedure: A-Flutter Ablation;  Surgeon: Evans Lance, MD;  Location: [place] [facility];  Service: Cardiovascular;  Laterality: N/A;  . BREAST SURGERY    . MOUTH SURGERY     to remove torus  . TONSILLECTOMY    . VEIN LIGATION     Dr Deon Pilling   Social History   Tobacco Use  . Smoking status: Never Smoker  . Smokeless tobacco: Never Used  Substance Use Topics  . Alcohol use: No  Marital Status: Married    Review of Systems  Cardiovascular: Negative for chest pain, dyspnea on exertion and leg swelling.  Musculoskeletal: Positive for arthritis and joint pain.  Gastrointestinal: Negative for melena.   Objective  Blood pressure (!) 154/76, pulse 63, resp. rate 16, height _0  (1.6 m), weight 136 lb (61.7 kg), SpO2 98 %. Body mass index is 24.09 kg/m.  Vitals with BMI 11/28/2020 11/28/2020 11/25/2019  Height - _1  _2   Weight - 136 lbs 137 lbs  BMI - 63.8 93.73  Systolic 428 768 115  Diastolic 76 74 69  Pulse - 63 72     Physical Exam Constitutional:      General: She is not in acute  distress.    Appearance: She is well-developed.     Comments: Appears younger than stated age  HENT:     Head: Atraumatic.  Eyes:     Conjunctiva/sclera: Conjunctivae normal.  Neck:     Thyroid: No thyromegaly.     Vascular: No JVD.  Cardiovascular:     Rate and Rhythm: Normal rate and regular rhythm.     Pulses: Intact distal pulses.     Heart sounds: Normal heart sounds. No murmur heard. No gallop.      Comments: Mild varicose veins noted right lower extremity Pulmonary:     Effort: Pulmonary effort is normal.     Breath sounds: Normal breath sounds.  Abdominal:     General: Bowel sounds are normal.     Palpations: Abdomen is soft.  Musculoskeletal:        General: Normal range of motion.     Cervical back: Neck supple.  Skin:    General: Skin is warm and dry.  Neurological:     Mental Status: She is alert.    Radiology: No results found.  Laboratory examination:    Hemoglobin 12.400 g/d 03/09/2020  Creatinine, Serum 0.650 mg/ 03/09/2020  Potassium 4.900 mm 03/09/2020 ALT (SGPT) 15.000 U/L 03/09/2020  Labs 04/30/2018: Glucose 87.  BUN/creatinine 15/0.52.  EGFR normal.  Sodium 138, potassium 4.6.  Rest of the CMP normal. H/H 11/33.  MCV 96.  Platelets 309.  Labs RBC 3.97, CBC otherwise normal.  Creatinine 0.64, EGFR 91/110, potassium 4.9, CMP normal.  Vitamin D 37.1.  Labs 06/04/2017: Potassium 4.5, BUN 14, creatinine 0.69, HB 11.5/HCT 34.9, platelets 257.  Normal indicis.  TSH normal.  Labs 12/06/2016: Serum glucose 105 mg, nonfasting.  Urine 13, serum creatinine 0.57, eGFR 94 mL.  It has some 4.7.  Current Outpatient Medications on File Prior to Visit  Medication Sig Dispense Refill  . apixaban (ELIQUIS) 5 MG TABS tablet Take 1 tablet (5 mg total) by mouth 2 (two) times daily. 180 tablet 3  . BIOTIN PO Take 2,000 mcg by mouth daily.     . Calcium Carbonate-Vitamin D (CALCIUM + D PO) Take 1 tablet by mouth 2 (two) times daily.     . cholecalciferol (VITAMIN D3)  25 MCG (1000 UNIT) tablet Take 1,000 Units by mouth daily.    . flecainide (TAMBOCOR) 150 MG tablet TAKE 1/2 TABLET (42m) BY MOUTH AT BREAKFAST AND AT BEDTIME 90 tablet 2  . lisinopril (ZESTRIL) 20 MG tablet Take 1 tablet (20 mg total) by mouth daily. (Patient taking differently: Take 20 mg by mouth at bedtime.) 90 tablet 3  . metoprolol succinate (TOPROL-XL) 25 MG 24 hr tablet Take 1 tablet (25 mg total) by mouth at bedtime. 90 tablet 3   No current facility-administered medications on file prior to visit.     Cardiac Studies:   Echocardiogram [12/06/2016]:  Left ventricle cavity is normal in size. Mild concentric hypertrophy of the left ventricle. Normal global wall motion. Doppler evidence of grade I (impaired) diastolic dysfunction, normal LAP. Calculated EF 67%. Left atrial cavity is moderately dilated AP diameter 4.3 cm, but appears larger. Mild tricuspid regurgitation. No evidence of pulmonary hypertension.   Treadmill stress test 07/18/2017 on Flecainide-Dr GCristopher Peru Normal without inducible arrhythmias.  EKG:   EKG 11/28/2020: Normal sinus rhythm at the rate of 60 bpm, left atrial enlargement, normal axis.  Incomplete right bundle branch block.  Normal QT interval.  No evidence of ischemia.  No significant change from 11/25/2019.  Assessment     ICD-10-CM   1. Paroxysmal atrial fibrillation (HCC) CHA2DS2-VASc Score is 3 with yearly risk of stroke of  3.2%.  I48.0   2. S/P ablation of atrial flutter 06/12/2017  Z98.890    Z86.79   3. Essential hypertension  I10 EKG 12-Lead  No orders of the defined types were placed in this encounter. There are no discontinued medications.    Recommendations:   Alexa CIRRINCIONEis a 74y.o.  with  paroxysmal atrial flutter status post RFA 05/2017 by Dr. TLovena Le had developed A. Fib during A. Flutter ablation by Dr. TLovena Leand hence on long term anticoagulation.  She saw Dr. GCristopher Peru 10/24/18, to continue anticoagulation for now due to  high cardioembolic risk. She is presently asymptomatic. Her blood pressure was elevated, however patient does record her blood pressures at home and has been stable at 130/80 mmHg or less. She will continue to monitor this closely and let me know if she continues to have elevated blood pressure.  No change in physical exam, no change in the EKG, Labs reviewed, lipids are also well controlled and stable renal function and CBC. No changes in the medications were done today. I will see her back on annual basis.   JAdrian Prows MD, [facility]12/13/2021, 4:30 PM Office: [phone]:  [phone]

## 2020-12-07 DIAGNOSIS — Z853 Personal history of malignant neoplasm of breast: Secondary | ICD-10-CM | POA: Diagnosis not present

## 2020-12-07 DIAGNOSIS — I10 Essential (primary) hypertension: Secondary | ICD-10-CM | POA: Diagnosis not present

## 2020-12-07 DIAGNOSIS — I4891 Unspecified atrial fibrillation: Secondary | ICD-10-CM | POA: Diagnosis not present

## 2020-12-07 DIAGNOSIS — M810 Age-related osteoporosis without current pathological fracture: Secondary | ICD-10-CM | POA: Diagnosis not present

## 2021-01-07 ENCOUNTER — Other Ambulatory Visit: Payer: Self-pay | Admitting: Cardiology

## 2021-01-07 DIAGNOSIS — I10 Essential (primary) hypertension: Secondary | ICD-10-CM

## 2021-01-07 DIAGNOSIS — I4891 Unspecified atrial fibrillation: Secondary | ICD-10-CM | POA: Diagnosis not present

## 2021-01-07 DIAGNOSIS — Z853 Personal history of malignant neoplasm of breast: Secondary | ICD-10-CM | POA: Diagnosis not present

## 2021-01-07 DIAGNOSIS — M810 Age-related osteoporosis without current pathological fracture: Secondary | ICD-10-CM | POA: Diagnosis not present

## 2021-01-25 DIAGNOSIS — L03012 Cellulitis of left finger: Secondary | ICD-10-CM | POA: Diagnosis not present

## 2021-01-30 ENCOUNTER — Other Ambulatory Visit: Payer: Self-pay | Admitting: Cardiology

## 2021-01-30 DIAGNOSIS — I480 Paroxysmal atrial fibrillation: Secondary | ICD-10-CM

## 2021-03-15 DIAGNOSIS — Z1389 Encounter for screening for other disorder: Secondary | ICD-10-CM | POA: Diagnosis not present

## 2021-03-15 DIAGNOSIS — M810 Age-related osteoporosis without current pathological fracture: Secondary | ICD-10-CM | POA: Diagnosis not present

## 2021-03-15 DIAGNOSIS — I4891 Unspecified atrial fibrillation: Secondary | ICD-10-CM | POA: Diagnosis not present

## 2021-03-15 DIAGNOSIS — Z853 Personal history of malignant neoplasm of breast: Secondary | ICD-10-CM | POA: Diagnosis not present

## 2021-03-15 DIAGNOSIS — Z136 Encounter for screening for cardiovascular disorders: Secondary | ICD-10-CM | POA: Diagnosis not present

## 2021-03-15 DIAGNOSIS — Z0000 Encounter for general adult medical examination without abnormal findings: Secondary | ICD-10-CM | POA: Diagnosis not present

## 2021-03-15 DIAGNOSIS — I10 Essential (primary) hypertension: Secondary | ICD-10-CM | POA: Diagnosis not present

## 2021-03-15 DIAGNOSIS — D6869 Other thrombophilia: Secondary | ICD-10-CM | POA: Diagnosis not present

## 2021-03-24 ENCOUNTER — Other Ambulatory Visit: Payer: Self-pay

## 2021-03-24 ENCOUNTER — Emergency Department (HOSPITAL_COMMUNITY): Payer: Medicare HMO

## 2021-03-24 ENCOUNTER — Emergency Department (HOSPITAL_COMMUNITY)
Admission: EM | Admit: 2021-03-24 | Discharge: 2021-03-24 | Disposition: A | Discharge status: Home / Self Care | Payer: Medicare HMO | Attending: Emergency Medicine | Admitting: Emergency Medicine

## 2021-03-24 ENCOUNTER — Encounter (HOSPITAL_COMMUNITY): Payer: Self-pay | Admitting: Emergency Medicine

## 2021-03-24 DIAGNOSIS — Z853 Personal history of malignant neoplasm of breast: Secondary | ICD-10-CM | POA: Insufficient documentation

## 2021-03-24 DIAGNOSIS — R002 Palpitations: Secondary | ICD-10-CM | POA: Diagnosis not present

## 2021-03-24 DIAGNOSIS — I4891 Unspecified atrial fibrillation: Secondary | ICD-10-CM

## 2021-03-24 DIAGNOSIS — Z79899 Other long term (current) drug therapy: Secondary | ICD-10-CM | POA: Insufficient documentation

## 2021-03-24 DIAGNOSIS — I10 Essential (primary) hypertension: Secondary | ICD-10-CM | POA: Diagnosis not present

## 2021-03-24 DIAGNOSIS — Z7901 Long term (current) use of anticoagulants: Secondary | ICD-10-CM | POA: Insufficient documentation

## 2021-03-24 DIAGNOSIS — R000 Tachycardia, unspecified: Secondary | ICD-10-CM | POA: Diagnosis not present

## 2021-03-24 DIAGNOSIS — Z8679 Personal history of other diseases of the circulatory system: Secondary | ICD-10-CM | POA: Diagnosis not present

## 2021-03-24 DIAGNOSIS — R079 Chest pain, unspecified: Secondary | ICD-10-CM | POA: Diagnosis not present

## 2021-03-24 DIAGNOSIS — I4892 Unspecified atrial flutter: Secondary | ICD-10-CM | POA: Insufficient documentation

## 2021-03-24 LAB — CBC
HCT: 39.5 % (ref 36.0–46.0)
Hemoglobin: 13.2 g/dL (ref 12.0–15.0)
MCH: 31.9 pg (ref 26.0–34.0)
MCHC: 33.4 g/dL (ref 30.0–36.0)
MCV: 95.4 fL (ref 80.0–100.0)
Platelets: 245 10*3/uL (ref 150–400)
RBC: 4.14 MIL/uL (ref 3.87–5.11)
RDW: 12.3 % (ref 11.5–15.5)
WBC: 8.9 10*3/uL (ref 4.0–10.5)
nRBC: 0 % (ref 0.0–0.2)

## 2021-03-24 LAB — COMPREHENSIVE METABOLIC PANEL
ALT: 18 U/L (ref 0–44)
AST: 22 U/L (ref 15–41)
Albumin: 4 g/dL (ref 3.5–5.0)
Alkaline Phosphatase: 61 U/L (ref 38–126)
Anion gap: 8 (ref 5–15)
BUN: 12 mg/dL (ref 8–23)
CO2: 26 mmol/L (ref 22–32)
Calcium: 9.6 mg/dL (ref 8.9–10.3)
Chloride: 102 mmol/L (ref 98–111)
Creatinine, Ser: 0.68 mg/dL (ref 0.44–1.00)
GFR, Estimated: >60 mL/min (ref >60)
Glucose, Bld: 122 mg/dL — ABNORMAL HIGH (ref 70–99)
Potassium: 4.2 mmol/L (ref 3.5–5.1)
Sodium: 136 mmol/L (ref 135–145)
Total Bilirubin: 0.3 mg/dL (ref 0.3–1.2)
Total Protein: 6.7 g/dL (ref 6.5–8.1)

## 2021-03-24 LAB — TROPONIN I (HIGH SENSITIVITY)
Troponin I (High Sensitivity): 12 ng/L (ref <18)
Troponin I (High Sensitivity): 14 ng/L (ref <18)

## 2021-03-24 MED ORDER — METOPROLOL TARTRATE 5 MG/5ML IV SOLN
5.0000 mg | Freq: Once | INTRAVENOUS | Status: AC
  Administered 2021-03-24: 5 mg via INTRAVENOUS
  Filled 2021-03-24: qty 5

## 2021-03-24 MED ORDER — OXYCODONE-ACETAMINOPHEN 5-325 MG PO TABS: 1.0000 | ORAL_TABLET | Freq: Once | ORAL | Status: DC

## 2021-03-24 MED ORDER — METOPROLOL TARTRATE 25 MG PO TABS: 25.0000 mg | ORAL_TABLET | Freq: Once | ORAL | Status: DC

## 2021-03-24 NOTE — ED Triage Notes (Signed)
Patient her from dr for tachycardia. No complaint of chest pain, just feels like heart is racing.

## 2021-03-24 NOTE — ED Notes (Signed)
Patient and husband given discharge paperwork and instructions. Verbalized understanding of teaching. IV d/c with cath tip intact. Ambulatory to exit in NAD with steady gait.

## 2021-03-24 NOTE — ED Provider Notes (Signed)
[facility] EMERGENCY DEPARTMENT Provider Note   CSN: [id number] Arrival date & time: 03/24/21  1826     History Chief Complaint  Patient presents with  . Tachycardia    Alexa Hansen is a 75 y.o. female.  She noticed that her heart rate was elevated, and she does have a history of atrial flutter and atrial fibrillation.  The only real symptom she has is a feeling of malaise.  She states that this is very mild, and she was able to do her Silver sneakers exercise class this morning.  The history is provided by the patient.  Weakness Severity:  Mild Onset quality:  Gradual Duration: sometime this morning she began to feel slightly more tired than usual.  Timing:  Constant Progression:  Unchanged Chronicity:  New Context comment:  She did forget her medication last night, but she took it this morning Relieved by:  Nothing Worsened by:  Nothing Ineffective treatments:  None tried Associated symptoms: no abdominal pain, no arthralgias, no chest pain, no cough, no difficulty walking, no dysuria, no fever, no myalgias, no nausea, no seizures, no sensory-motor deficit, no shortness of breath, no vision change and no vomiting        Past Medical History:  Diagnosis Date  . Atrial flutter ([place])   . Breast cancer ([place])   . Hypertension   . Syncope     Patient Active Problem List   Diagnosis Date Noted  . Essential hypertension 05/18/2019  . Atrial flutter ([place]) 06/12/2017  . Atrial flutter by electrocardiogram ([place]) 06/04/2017    Past Surgical History:  Procedure Laterality Date  . A-FLUTTER ABLATION N/A 06/12/2017   Procedure: A-Flutter Ablation;  Surgeon: Evans Lance, MD;  Location: [place] [facility];  Service: Cardiovascular;  Laterality: N/A;  . BREAST SURGERY    . MOUTH SURGERY     to remove torus  . TONSILLECTOMY    . VEIN LIGATION     Dr Deon Pilling     OB History   No obstetric history on file.     Family History  Problem Relation Age  of Onset  . Colon cancer Neg Hx     Social History   Tobacco Use  . Smoking status: Never Smoker  . Smokeless tobacco: Never Used  Vaping Use  . Vaping Use: Never used  Substance Use Topics  . Alcohol use: No  . Drug use: No    Home Medications Prior to Admission medications   Medication Sig Start Date End Date Taking? Authorizing Provider  acetaminophen (TYLENOL) 500 MG tablet Take 500 mg by mouth every 6 (six) hours as needed for mild pain (or headaches).   Yes [provider]  Biotin 1000 MCG tablet Take 1,000 mcg by mouth in the morning and at bedtime.   Yes [provider]  Calcium Carb-Cholecalciferol (CALCIUM+D3 PO) Take 1 tablet by mouth in the morning and at bedtime.   Yes [provider]  cholecalciferol (VITAMIN D3) 25 MCG (1000 UNIT) tablet Take 1,000 Units by mouth in the morning.   Yes [provider]  ELIQUIS 5 MG TABS tablet TAKE ONE TABLET BY MOUTH AT BREAKFAST AND AT BEDTIME Patient taking differently: Take 5 mg by mouth See admin instructions. Take 5 mg by mouth with breakfast and at bedtime 01/30/21  Yes Adrian Prows, MD  flecainide (TAMBOCOR) 150 MG tablet TAKE 1/2 TABLET (75mg ) BY MOUTH AT BREAKFAST AND AT BEDTIME Patient taking differently: Take 75 mg by mouth  See admin instructions. Take 75 mg by mouth with breakfast and at bedtime 10/10/20  Yes Adrian Prows, MD  lisinopril (ZESTRIL) 20 MG tablet TAKE ONE TABLET BY MOUTH EVERYDAY AT BEDTIME Patient taking differently: Take 20 mg by mouth at bedtime. 01/09/21  Yes Adrian Prows, MD  metoprolol succinate (TOPROL-XL) 25 MG 24 hr tablet Take 1 tablet (25 mg total) by mouth at bedtime. 10/10/20  Yes Adrian Prows, MD    Allergies    Fosamax [alendronate] and Doxycycline  Review of Systems   Review of Systems  Constitutional: Negative for chills and fever.  HENT: Negative for ear pain and sore throat.   Eyes: Negative for pain and visual disturbance.  Respiratory: Negative for cough  and shortness of breath.   Cardiovascular: Negative for chest pain and palpitations.  Gastrointestinal: Negative for abdominal pain, nausea and vomiting.  Genitourinary: Negative for dysuria and hematuria.  Musculoskeletal: Negative for arthralgias, back pain and myalgias.  Skin: Negative for color change and rash.  Neurological: Positive for weakness. Negative for seizures and syncope.  All other systems reviewed and are negative.   Physical Exam Updated Vital Signs BP (!) 126/109   Pulse (!) 145   Temp 98.7 F (37.1 C) (Oral)   Resp 17   SpO2 97%   Physical Exam Vitals and nursing note reviewed.  Constitutional:      General: She is not in acute distress.    Appearance: She is well-developed.  HENT:     Head: Normocephalic and atraumatic.  Eyes:     Conjunctiva/sclera: Conjunctivae normal.  Cardiovascular:     Rate and Rhythm: Regular rhythm. Tachycardia present.     Heart sounds: No murmur heard.   Pulmonary:     Effort: Pulmonary effort is normal. No respiratory distress.     Breath sounds: Normal breath sounds.  Abdominal:     Palpations: Abdomen is soft.     Tenderness: There is no abdominal tenderness.  Musculoskeletal:     Cervical back: Neck supple.  Skin:    General: Skin is warm and dry.  Neurological:     Mental Status: She is alert.     ED Results / Procedures / Treatments   Labs (all labs ordered are listed, but only abnormal results are displayed) Labs Reviewed  COMPREHENSIVE METABOLIC PANEL - Abnormal; Notable for the following components:      Result Value   Glucose, Bld 122 (*)    All other components within normal limits  CBC  TROPONIN I (HIGH SENSITIVITY)  TROPONIN I (HIGH SENSITIVITY)    EKG EKG Interpretation  Date/Time:  Friday March 24 2021 19:20:52 EDT Ventricular Rate:  140 PR Interval:    QRS Duration: 90 QT Interval:  295 QTC Calculation: 451 R Axis:   30 Text Interpretation: Atrial fibrillation with rapid ventricular  response axis normal No acute ischemia Confirmed by Lorre Munroe ([id number]) on 03/24/2021 8:26:12 PM   Radiology DG Chest Port 1 View  Result Date: 03/24/2021 CLINICAL DATA:  Chest pain EXAM: PORTABLE CHEST 1 VIEW COMPARISON:  06/04/2017 FINDINGS: No focal opacity or pleural effusion. Stable cardiomediastinal silhouette. No pneumothorax. IMPRESSION: No active disease. Electronically Signed   By: Donavan Foil M.D.   On: 03/24/2021 19:37    Procedures Procedures   Medications Ordered in ED Medications  metoprolol tartrate (LOPRESSOR) injection 5 mg (5 mg Intravenous Given 03/24/21 2001)  metoprolol tartrate (LOPRESSOR) injection 5 mg (5 mg Intravenous Given 03/24/21 2040)  metoprolol tartrate (LOPRESSOR) injection 5 mg (  5 mg Intravenous Given 03/24/21 2233)    ED Course  I have reviewed the triage vital signs and the nursing notes.  Pertinent labs & imaging results that were available during my care of the patient were reviewed by me and considered in my medical decision making (see chart for details).  Clinical Course as of 03/24/21 2311  Fri Mar 24, 2021  2047 I spoke with Dr. Einar Gip.  He would like to give the patient oral Lopressor and then continue to give her IV Lopressor.  He would like to observe her for several hours but think she can be safely discharged home. [AW]  2102 The patient has her home medications with her.  They are labeled in package.  She will take her 25 mg of metoprolol, 5 mg of Eliquis, and her flecainide. [AW]    Clinical Course User Index [AW] Arnaldo Natal, MD   MDM Rules/Calculators/A&P                          Claudie Revering resented with atrial fibrillation and atrial flutter without rate control.  Initial heart rates in the 150s and had been relatively stable in the 1 teens for several hours prior to discharge.  This was likely triggered by accidentally forgetting to take her medication last night.  No indication of acute coronary syndrome.  No signs or symptoms of  an infection.  She will be discharged home with recommendations to continue her home medication and to follow-up with cardiology as scheduled. Final Clinical Impression(s) / ED Diagnoses Final diagnoses:  Atrial fibrillation with RVR [facility])    Rx / DC Orders ED Discharge Orders    None       Arnaldo Natal, MD 03/24/21 2313

## 2021-03-24 NOTE — ED Triage Notes (Signed)
Emergency Medicine Provider Triage Evaluation Note  Alexa Hansen , a 75 y.o. female  was evaluated in triage.  Pt complains of a flutter with RVR.  Felt tired this afternoon and when she checked bp this afternoon found that she was tachycardic.  No chest pain, shortness of breath.    Status post ablation 2018.    On eliquis flecanide, metoprolol, and eliquis.    Review of Systems  Positive: tachycardia Negative: Chest pain, shob  Physical Exam  BP (!) 137/120 (BP Location: Right Arm)   Pulse (!) 159   Temp 98.7 F (37.1 C) (Oral)   Resp 16   SpO2 100%  Gen:   Awake, no distress   HEENT:  Atraumatic  Resp:  Normal effort  Cardiac:  Tachycardia at rate of 159  MSK:   Moves extremities without difficulty  Neuro:  Speech clear   Medical Decision Making  Medically screening exam initiated at 6:57 PM.  Appropriate orders placed.  Alexa Hansen was informed that the remainder of the evaluation will be completed by another provider, this initial triage assessment does not replace that evaluation, and the importance of remaining in the ED until their evaluation is complete.  Clinical Impression   Atrial flutter with RVR.  Patient was moved directly back to room after screening.     Loni Beckwith, [place] 03/24/21 1902

## 2021-04-05 ENCOUNTER — Other Ambulatory Visit: Payer: Self-pay | Admitting: Cardiology

## 2021-04-05 DIAGNOSIS — Z853 Personal history of malignant neoplasm of breast: Secondary | ICD-10-CM | POA: Diagnosis not present

## 2021-04-05 DIAGNOSIS — I10 Essential (primary) hypertension: Secondary | ICD-10-CM | POA: Diagnosis not present

## 2021-04-05 DIAGNOSIS — E7800 Pure hypercholesterolemia, unspecified: Secondary | ICD-10-CM | POA: Diagnosis not present

## 2021-04-05 DIAGNOSIS — M810 Age-related osteoporosis without current pathological fracture: Secondary | ICD-10-CM | POA: Diagnosis not present

## 2021-04-05 DIAGNOSIS — I4891 Unspecified atrial fibrillation: Secondary | ICD-10-CM | POA: Diagnosis not present

## 2021-04-05 DIAGNOSIS — I480 Paroxysmal atrial fibrillation: Secondary | ICD-10-CM

## 2021-04-07 DIAGNOSIS — M810 Age-related osteoporosis without current pathological fracture: Secondary | ICD-10-CM | POA: Diagnosis not present

## 2021-04-10 ENCOUNTER — Ambulatory Visit: Payer: Medicare HMO | Admitting: Student

## 2021-04-27 ENCOUNTER — Encounter: Payer: Self-pay | Admitting: Cardiology

## 2021-04-27 ENCOUNTER — Ambulatory Visit: Payer: Medicare HMO | Admitting: Cardiology

## 2021-04-27 ENCOUNTER — Other Ambulatory Visit: Payer: Self-pay

## 2021-04-27 VITALS — BP 154/79 | HR 93 | Temp 98.6°F | Resp 17 | Ht 63.0 in | Wt 137.0 lb

## 2021-04-27 DIAGNOSIS — I480 Paroxysmal atrial fibrillation: Secondary | ICD-10-CM

## 2021-04-27 DIAGNOSIS — I10 Essential (primary) hypertension: Secondary | ICD-10-CM | POA: Diagnosis not present

## 2021-04-27 MED ORDER — FLECAINIDE ACETATE 150 MG PO TABS: 75.0000 mg | ORAL_TABLET | Freq: Two times a day (BID) | ORAL | 3 refills | Status: DC

## 2021-04-27 MED ORDER — ELIQUIS 5 MG PO TABS: 5.0000 mg | ORAL_TABLET | Freq: Two times a day (BID) | ORAL | 3 refills | Status: DC

## 2021-04-27 NOTE — Progress Notes (Signed)
Primary Physician/Referring:  [facility]  Patient ID: Alexa Hansen, female    DOB: 1946-02-16, 75 y.o.   MRN: [id number]  Chief Complaint  Patient presents with  . Follow-up  . Atrial Fibrillation    HPI: Alexa Hansen  is a 75 y.o. female with  paroxysmal atrial flutter status post RFA 05/2017 by Dr. Lovena Le, she had developed A. Fib during A. Flutter ablation by Dr. Lovena Le and hence on long term anticoagulation.  Patient was seen in the emergency room on 03/24/2021 with marked generalized weakness and was seen by her PCP and found to be in A. fib with RVR and transferred to the ED.  EKG revealed atrial fibrillation with rapid ventricular sponsor rate of 140 bpm, however she spontaneously converted to sinus rhythm with administration of intravenous metoprolol and felt stable for discharge without any change in her medications and to follow-up with me.  She has not had any further episodes of fatigue.  She has multiple questions regarding her hospitalization and atrial fibrillation.  Past Medical History:  Diagnosis Date  . Atrial flutter ([place])   . Breast cancer ([place])   . Hypertension   . Syncope     Past Surgical History:  Procedure Laterality Date  . A-FLUTTER ABLATION N/A 06/12/2017   Procedure: A-Flutter Ablation;  Surgeon: Evans Lance, MD;  Location: [place] [facility];  Service: Cardiovascular;  Laterality: N/A;  . BREAST SURGERY    . MOUTH SURGERY     to remove torus  . TONSILLECTOMY    . VEIN LIGATION     Dr Deon Pilling   Social History   Tobacco Use  . Smoking status: Never Smoker  . Smokeless tobacco: Never Used  Substance Use Topics  . Alcohol use: No  Marital Status: Married    Review of Systems  Cardiovascular: Negative for chest pain, dyspnea on exertion and leg swelling.  Musculoskeletal: Positive for arthritis and joint pain.  Gastrointestinal: Negative for melena.   Objective  Blood pressure (!) 154/79, pulse 93, temperature  98.6 F (37 C), temperature source Temporal, resp. rate 17, height _0  (1.6 m), weight 137 lb (62.1 kg), SpO2 96 %. Body mass index is 24.27 kg/m.  Vitals with BMI 04/27/2021 04/27/2021 03/24/2021  Height - _1  -  Weight - 137 lbs -  BMI - 42.70 -  Systolic [phone]  Diastolic 79 71 82  Pulse 93 75 118     Physical Exam Constitutional:      General: She is not in acute distress.    Appearance: She is well-developed.     Comments: Appears younger than stated age  HENT:     Head: Atraumatic.  Eyes:     Conjunctiva/sclera: Conjunctivae normal.  Neck:     Thyroid: No thyromegaly.     Vascular: No JVD.  Cardiovascular:     Rate and Rhythm: Normal rate and regular rhythm.     Pulses: Intact distal pulses.     Heart sounds: Normal heart sounds. No murmur heard. No gallop.      Comments: Mild varicose veins noted right lower extremity Pulmonary:     Effort: Pulmonary effort is normal.     Breath sounds: Normal breath sounds.  Abdominal:     General: Bowel sounds are normal.     Palpations: Abdomen is soft.  Musculoskeletal:        General: Normal range of motion.     Cervical back: Neck supple.  Skin:    General: Skin is warm and dry.  Neurological:     Mental Status: She is alert.    Radiology: No results found.  Laboratory examination:   External labs:  Labs 03/24/2021:  Hb 13.0/HCT 38.7, platelets 240.  Normal indicis.  Serum glucose 96 mg, BUN 15, creatinine 0.64, EGFR 92 mL, potassium 5.2.  Total cholesterol 193, triglycerides 83, HDL 67, LDL 111.  Non-HDL cholesterol 126.   Hemoglobin 12.400 g/d 03/09/2020  Creatinine, Serum 0.650 mg/ 03/09/2020  Potassium 4.900 mm 03/09/2020 ALT (SGPT) 15.000 U/L 03/09/2020  Labs 04/30/2018: Glucose 87.  BUN/creatinine 15/0.52.  EGFR normal.  Sodium 138, potassium 4.6.  Rest of the CMP normal. H/H 11/33.  MCV 96.  Platelets 309.  Labs RBC 3.97, CBC otherwise normal.  Creatinine 0.64, EGFR 91/110, potassium 4.9,  CMP normal.  Vitamin D 37.1.  Labs 06/04/2017: Potassium 4.5, BUN 14, creatinine 0.69, HB 11.5/HCT 34.9, platelets 257.  Normal indicis.  TSH normal.  Labs 12/06/2016: Serum glucose 105 mg, nonfasting.  Urine 13, serum creatinine 0.57, eGFR 94 mL.  It has some 4.7.   Current Outpatient Medications on File Prior to Visit  Medication Sig Dispense Refill  . acetaminophen (TYLENOL) 500 MG tablet Take 500 mg by mouth every 6 (six) hours as needed for mild pain (or headaches).    . Biotin 1000 MCG tablet Take 1,000 mcg by mouth in the morning and at bedtime.    . Calcium Carb-Cholecalciferol (CALCIUM+D3 PO) Take 1 tablet by mouth in the morning and at bedtime.    . cholecalciferol (VITAMIN D3) 25 MCG (1000 UNIT) tablet Take 1,000 Units by mouth in the morning.    . denosumab (PROLIA) 60 MG/ML SOSY injection Inject 60 mg into the skin every 6 (six) months.    Marland Kitchen lisinopril (ZESTRIL) 20 MG tablet TAKE ONE TABLET BY MOUTH EVERYDAY AT BEDTIME 90 tablet 1  . metoprolol succinate (TOPROL-XL) 25 MG 24 hr tablet Take 1 tablet (25 mg total) by mouth at bedtime. 90 tablet 3   No current facility-administered medications on file prior to visit.     Cardiac Studies:   Echocardiogram [12/06/2016]:  Left ventricle cavity is normal in size. Mild concentric hypertrophy of the left ventricle. Normal global wall motion. Doppler evidence of grade I (impaired) diastolic dysfunction, normal LAP. Calculated EF 67%. Left atrial cavity is moderately dilated AP diameter 4.3 cm, but appears larger. Mild tricuspid regurgitation. No evidence of pulmonary hypertension.   Treadmill stress test 07/18/2017 on Flecainide-Dr [name]: Normal without inducible arrhythmias.  EKG:  EKG 04/27/2021: Normal sinus rhythm at rate of 70 bpm, left atrial enlargement, normal axis.  No evidence of ischemia otherwise normal EKG.  Normal QT interval.  EKG 03/24/2021: Atrial fibrillation with rapid ventricular response at rate of 140 bpm,  normal axis, no evidence of ischemia.  Low-voltage complexes.   EKG 11/28/2020: Normal sinus rhythm at the rate of 60 bpm, left atrial enlargement, normal axis.  Incomplete right bundle branch block.  Normal QT interval.  No evidence of ischemia.  No significant change from 11/25/2019.  Assessment     ICD-10-CM   1. Paroxysmal atrial fibrillation (HCC) CHA2DS2-VASc Score is 3 with yearly risk of stroke of  3.2%.  I48.0 PCV ECHOCARDIOGRAM COMPLETE    apixaban (ELIQUIS) 5 MG TABS tablet    flecainide (TAMBOCOR) 150 MG tablet    DISCONTINUED: flecainide (TAMBOCOR) 150 MG tablet  2. Essential hypertension  I10 EKG 12-Lead  3. White coat syndrome  with diagnosis of hypertension  I10      CHA2DS2-VASc Score is 2.  Yearly risk of stroke: 2.3% (A, F).  Score of 1=0.6; 2=2.2; 3=3.2; 4=4.8; 5=7.2; 6=9.8; 7=>9.8) -(CHF; HTN; vasc disease DM,  Female = 1; Age <65 =0; 65-74 = 1,  >75 =2; stroke/embolism= 2).    Meds ordered this encounter  Medications  . DISCONTD: flecainide (TAMBOCOR) 150 MG tablet    Sig: Take 0.5 tablets (75 mg total) by mouth 2 (two) times daily. Take extra 1 tab for A. Fib onset. Not to exceed 2 tablets per day.    Dispense:  100 tablet    Refill:  3    This prescription was filled on 09/19/2020. Any refills authorized will be placed on file.  Marland Kitchen apixaban (ELIQUIS) 5 MG TABS tablet    Sig: Take 1 tablet (5 mg total) by mouth 2 (two) times daily.    Dispense:  200 tablet    Refill:  3  . flecainide (TAMBOCOR) 150 MG tablet    Sig: Take 0.5 tablets (75 mg total) by mouth 2 (two) times daily. Take extra 1 tab for A. Fib onset. Not to exceed 2 tablets per day.    Dispense:  100 tablet    Refill:  3    This prescription was filled on 09/19/2020. Any refills authorized will be placed on file.   Medications Discontinued During This Encounter  Medication Reason  . flecainide (TAMBOCOR) 150 MG tablet   . ELIQUIS 5 MG TABS tablet Reorder  . flecainide (TAMBOCOR) 150 MG tablet        Recommendations:   Alexa Hansen is a 75 y.o.  with  paroxysmal atrial flutter status post RFA 05/2017 by Dr. Lovena Le, she had developed A. Fib during A. Flutter ablation by Dr. Lovena Le and hence on long term anticoagulation.  Patient was seen in the emergency room on 03/24/2021 after she presented to her PCP with blood pressure monitor stating heart rate to be 130 bpm and also having experienced fatigue for the past 1 day since morning, found to be in atrial fibrillation with rapid ventricular sponsor rate of 140 bpm, and converted to sinus rhythm with intravenous metoprolol, felt stable for discharge without any change in her medications and to follow-up with me.  She is accompanied by her husband, she has multiple questions regarding medications, atrial flutter and atrial fibrillation as she has had atrial flutter ablation in the past.  After evaluation of the EKG, she clearly had atrial fibrillation with RVR.  Advised her that she can continue with present medications, flecainide 75 mg twice daily and if she has breakthrough atrial fibrillation to take 1 extra dose of flecainide 150 mg but not exceed a total of 2 tablets/day.  Advised her that we will continue to evaluate for atrial fibrillation burden and if she notices fatigue or elevated heart rate by her blood pressure monitoring,  Her blood pressure is elevated today, she brings home blood pressure recordings which are under perfect control.  Hence I did not make any changes to her medications.  She has whitecoat hypertension.  I reviewed her external labs, with regard to elevated LDL, her non-HDL cholesterol is at goal.  She has no known atherosclerosis, no known coronary artery disease or vascular disease, nondiabetic and a non-smoker.  Hence no statin therapy is indicated at this time.  I will see her back in 6 months.  Husband present and all questions answered.  40-minute office visit encounter.  Adrian Prows, MD, [facility] 04/27/2021, 5:17  PM Office: [phone] Pager: [phone]

## 2021-05-08 ENCOUNTER — Other Ambulatory Visit: Payer: Self-pay

## 2021-05-08 ENCOUNTER — Ambulatory Visit: Payer: Medicare HMO

## 2021-05-08 DIAGNOSIS — I480 Paroxysmal atrial fibrillation: Secondary | ICD-10-CM

## 2021-05-10 NOTE — Progress Notes (Signed)
Echocardiogram 05/08/2021:  Normal LV systolic function with EF 62%. Left ventricle cavity is normal in size. Normal left ventricular wall thickness. Normal global wall motion. Normal diastolic filling pattern. Calculated EF 62%. Left atrial cavity is moderate to severely dilated at 4.2 cm. LA size stated with volume index calculation. Trileaflet aortic valve. No evidence of aortic stenosis. Trace aortic regurgitation. Structurally normal mitral valve. No evidence of mitral stenosis. Mild (Grade I) mitral regurgitation. Structurally normal tricuspid valve. No evidence of tricuspid stenosis. Mild tricuspid regurgitation. No evidence of pulmonary hypertension. No significant change from 12/06/2016.

## 2021-07-06 DIAGNOSIS — R202 Paresthesia of skin: Secondary | ICD-10-CM | POA: Diagnosis not present

## 2021-07-06 DIAGNOSIS — L819 Disorder of pigmentation, unspecified: Secondary | ICD-10-CM | POA: Diagnosis not present

## 2021-07-06 DIAGNOSIS — L814 Other melanin hyperpigmentation: Secondary | ICD-10-CM | POA: Diagnosis not present

## 2021-07-06 DIAGNOSIS — L821 Other seborrheic keratosis: Secondary | ICD-10-CM | POA: Diagnosis not present

## 2021-07-06 DIAGNOSIS — D229 Melanocytic nevi, unspecified: Secondary | ICD-10-CM | POA: Diagnosis not present

## 2021-07-07 DIAGNOSIS — M810 Age-related osteoporosis without current pathological fracture: Secondary | ICD-10-CM | POA: Diagnosis not present

## 2021-07-07 DIAGNOSIS — I4891 Unspecified atrial fibrillation: Secondary | ICD-10-CM | POA: Diagnosis not present

## 2021-07-07 DIAGNOSIS — I10 Essential (primary) hypertension: Secondary | ICD-10-CM | POA: Diagnosis not present

## 2021-07-07 DIAGNOSIS — E7800 Pure hypercholesterolemia, unspecified: Secondary | ICD-10-CM | POA: Diagnosis not present

## 2021-07-17 DIAGNOSIS — Z1231 Encounter for screening mammogram for malignant neoplasm of breast: Secondary | ICD-10-CM | POA: Diagnosis not present

## 2021-07-17 DIAGNOSIS — Z803 Family history of malignant neoplasm of breast: Secondary | ICD-10-CM | POA: Diagnosis not present

## 2021-08-24 DIAGNOSIS — Z79899 Other long term (current) drug therapy: Secondary | ICD-10-CM | POA: Diagnosis not present

## 2021-08-24 DIAGNOSIS — Z818 Family history of other mental and behavioral disorders: Secondary | ICD-10-CM | POA: Diagnosis not present

## 2021-08-24 DIAGNOSIS — I10 Essential (primary) hypertension: Secondary | ICD-10-CM | POA: Diagnosis not present

## 2021-08-24 DIAGNOSIS — M810 Age-related osteoporosis without current pathological fracture: Secondary | ICD-10-CM | POA: Diagnosis not present

## 2021-08-24 DIAGNOSIS — D6869 Other thrombophilia: Secondary | ICD-10-CM | POA: Diagnosis not present

## 2021-08-24 DIAGNOSIS — Z803 Family history of malignant neoplasm of breast: Secondary | ICD-10-CM | POA: Diagnosis not present

## 2021-08-24 DIAGNOSIS — I4891 Unspecified atrial fibrillation: Secondary | ICD-10-CM | POA: Diagnosis not present

## 2021-08-24 DIAGNOSIS — G8929 Other chronic pain: Secondary | ICD-10-CM | POA: Diagnosis not present

## 2021-08-24 DIAGNOSIS — Z008 Encounter for other general examination: Secondary | ICD-10-CM | POA: Diagnosis not present

## 2021-08-24 DIAGNOSIS — E663 Overweight: Secondary | ICD-10-CM | POA: Diagnosis not present

## 2021-08-24 DIAGNOSIS — M2550 Pain in unspecified joint: Secondary | ICD-10-CM | POA: Diagnosis not present

## 2021-08-24 DIAGNOSIS — Z7901 Long term (current) use of anticoagulants: Secondary | ICD-10-CM | POA: Diagnosis not present

## 2021-08-24 DIAGNOSIS — I739 Peripheral vascular disease, unspecified: Secondary | ICD-10-CM | POA: Diagnosis not present

## 2021-09-25 DIAGNOSIS — H524 Presbyopia: Secondary | ICD-10-CM | POA: Diagnosis not present

## 2021-09-27 DIAGNOSIS — Z0100 Encounter for examination of eyes and vision without abnormal findings: Secondary | ICD-10-CM | POA: Diagnosis not present

## 2021-09-30 ENCOUNTER — Other Ambulatory Visit: Payer: Self-pay | Admitting: Cardiology

## 2021-09-30 DIAGNOSIS — I10 Essential (primary) hypertension: Secondary | ICD-10-CM

## 2021-09-30 DIAGNOSIS — I480 Paroxysmal atrial fibrillation: Secondary | ICD-10-CM

## 2021-10-04 DIAGNOSIS — I4891 Unspecified atrial fibrillation: Secondary | ICD-10-CM | POA: Diagnosis not present

## 2021-10-04 DIAGNOSIS — I10 Essential (primary) hypertension: Secondary | ICD-10-CM | POA: Diagnosis not present

## 2021-10-04 DIAGNOSIS — M810 Age-related osteoporosis without current pathological fracture: Secondary | ICD-10-CM | POA: Diagnosis not present

## 2021-10-04 DIAGNOSIS — E7800 Pure hypercholesterolemia, unspecified: Secondary | ICD-10-CM | POA: Diagnosis not present

## 2021-10-04 DIAGNOSIS — Z853 Personal history of malignant neoplasm of breast: Secondary | ICD-10-CM | POA: Diagnosis not present

## 2021-10-09 DIAGNOSIS — M810 Age-related osteoporosis without current pathological fracture: Secondary | ICD-10-CM | POA: Diagnosis not present

## 2021-10-30 ENCOUNTER — Ambulatory Visit: Payer: Medicare HMO | Admitting: Cardiology

## 2021-11-07 ENCOUNTER — Ambulatory Visit: Payer: Medicare HMO | Admitting: Cardiology

## 2021-11-07 ENCOUNTER — Other Ambulatory Visit: Payer: Self-pay

## 2021-11-07 ENCOUNTER — Encounter: Payer: Self-pay | Admitting: Cardiology

## 2021-11-07 VITALS — BP 155/64 | HR 60 | Temp 97.9°F | Resp 17 | Ht 63.0 in | Wt 136.2 lb

## 2021-11-07 DIAGNOSIS — Z8679 Personal history of other diseases of the circulatory system: Secondary | ICD-10-CM | POA: Diagnosis not present

## 2021-11-07 DIAGNOSIS — I10 Essential (primary) hypertension: Secondary | ICD-10-CM | POA: Diagnosis not present

## 2021-11-07 DIAGNOSIS — I480 Paroxysmal atrial fibrillation: Secondary | ICD-10-CM

## 2021-11-07 DIAGNOSIS — Z9889 Other specified postprocedural states: Secondary | ICD-10-CM | POA: Diagnosis not present

## 2021-11-07 MED ORDER — METOPROLOL SUCCINATE ER 25 MG PO TB24: 25.0000 mg | ORAL_TABLET | Freq: Every day | ORAL | 3 refills | Status: DC

## 2021-11-07 MED ORDER — LISINOPRIL 20 MG PO TABS: 20.0000 mg | ORAL_TABLET | Freq: Every day | ORAL | 3 refills | Status: DC

## 2021-11-07 NOTE — Progress Notes (Signed)
Primary Physician/Referring:  Kathyrn Lass, MD  Patient ID: Alexa Hansen, female    DOB: Feb 22, 1946, 75 y.o.   MRN: [id number]  Chief Complaint  Patient presents with   Follow-up    6 MONTH   Atrial Fibrillation    HPI: Alexa Hansen  is a 75 y.o. female with  paroxysmal atrial flutter status post RFA 05/2017 by Dr. Lovena Le, she had developed A. Fib during A. Flutter ablation by Dr. Lovena Le and hence on long term anticoagulation.  She also has had an episode of atrial fibrillation on 03/24/2021 when she presented to the emergency room and spontaneously converted to sinus rhythm.  Past medical history significant for hypertension and whitecoat hypertension.  She remains asymptomatic and is tolerating all medications well.  Past Medical History:  Diagnosis Date   Atrial flutter ([place])    Breast cancer ([place])    Hypertension    Syncope     Past Surgical History:  Procedure Laterality Date   A-FLUTTER ABLATION N/A 06/12/2017   Procedure: A-Flutter Ablation;  Surgeon: Evans Lance, MD;  Location: [place] [facility];  Service: Cardiovascular;  Laterality: N/A;   BREAST SURGERY     MOUTH SURGERY     to remove torus   TONSILLECTOMY     VEIN LIGATION     Dr Deon Pilling   Social History   Tobacco Use   Smoking status: Never   Smokeless tobacco: Never  Substance Use Topics   Alcohol use: No  Marital Status: Married    Review of Systems  Cardiovascular:  Negative for chest pain, dyspnea on exertion and leg swelling.  Musculoskeletal:  Positive for arthritis and joint pain.  Gastrointestinal:  Negative for melena.  Objective  Blood pressure (!) 155/64, pulse 60, temperature 97.9 F (36.6 C), temperature source Temporal, resp. rate 17, height '5\' 3"'  (1.6 m), weight 136 lb 3.2 oz (61.8 kg), SpO2 99 %. Body mass index is 24.13 kg/m.  Vitals with BMI 11/07/2021 11/07/2021 04/27/2021  Height - '5\' 3"'  -  Weight - 136 lbs 3 oz -  BMI - 37.62 -  Systolic 831 517 616  Diastolic 64 77 79   Pulse 60 61 93     Physical Exam Neck:     Vascular: No carotid bruit or JVD.  Cardiovascular:     Rate and Rhythm: Normal rate and regular rhythm.     Pulses: Intact distal pulses.     Heart sounds: Normal heart sounds. No murmur heard.   No gallop.  Pulmonary:     Effort: Pulmonary effort is normal.     Breath sounds: Normal breath sounds.  Abdominal:     General: Bowel sounds are normal.     Palpations: Abdomen is soft.  Musculoskeletal:        General: No swelling.   Radiology: No results found.  Laboratory examination:   External labs:  Labs 03/24/2021:  Hb 13.0/HCT 38.7, platelets 240.  Normal indicis.  Serum glucose 96 mg, BUN 15, creatinine 0.64, EGFR 92 mL, potassium 5.2.  Total cholesterol 193, triglycerides 83, HDL 67, LDL 111.  Non-HDL cholesterol 126.  Current Outpatient Medications on File Prior to Visit  Medication Sig Dispense Refill   acetaminophen (TYLENOL) 500 MG tablet Take 500 mg by mouth every 6 (six) hours as needed for mild pain (or headaches).     apixaban (ELIQUIS) 5 MG TABS tablet Take 1 tablet (5 mg total) by mouth 2 (two) times daily. 200 tablet 3  Biotin 1000 MCG tablet Take 1,000 mcg by mouth in the morning and at bedtime.     Calcium Carb-Cholecalciferol (CALCIUM+D3 PO) Take 1 tablet by mouth in the morning and at bedtime.     cholecalciferol (VITAMIN D3) 25 MCG (1000 UNIT) tablet Take 1,000 Units by mouth in the morning.     denosumab (PROLIA) 60 MG/ML SOSY injection Inject 60 mg into the skin every 6 (six) months.     flecainide (TAMBOCOR) 150 MG tablet Take 0.5 tablets (75 mg total) by mouth 2 (two) times daily. Take extra 1 tab for A. Fib onset. Not to exceed 2 tablets per day. 100 tablet 3   No current facility-administered medications on file prior to visit.     Cardiac Studies:   Treadmill stress test Jul 29, 2017 on Flecainide-Dr [name]: Normal without inducible arrhythmias.  Echocardiogram 05/08/2021:  Normal LV  systolic function with EF 62%. Left ventricle cavity is normal in size. Normal left ventricular wall thickness. Normal global wall motion. Normal diastolic filling pattern. Calculated EF 62%. Left atrial cavity is moderate to severely dilated at 4.2 cm. LA size stated with volume index calculation. Trileaflet aortic valve. No evidence of aortic stenosis. Trace aortic regurgitation. Structurally normal mitral valve. No evidence of mitral stenosis. Mild (Grade I) mitral regurgitation. Structurally normal tricuspid valve. No evidence of tricuspid stenosis. Mild tricuspid regurgitation. No evidence of pulmonary hypertension. No significant change from 12/06/2016.   EKG:  EKG 11/07/2021: Normal sinus rhythm at rate of 56 bpm, left [place] enlargement, normal axis.  Incomplete right branch block.  No significant change from 04/27/2021.  Previously heart rate was 70 bpm.  EKG 03/24/2021: Atrial fibrillation with rapid ventricular response at rate of 140 bpm, normal axis, no evidence of ischemia.  Low-voltage complexes.  Assessment     ICD-10-CM   1. Paroxysmal atrial fibrillation (HCC)  I48.0 EKG 12-Lead    2. S/P ablation of atrial flutter 06/12/2017  Z98.890    Z86.79     3. White coat syndrome with diagnosis of hypertension  I10     4. Paroxysmal atrial fibrillation (HCC) CHA2DS2-VASc Score is 3 with yearly risk of stroke of  3.2%.  I48.0 metoprolol succinate (TOPROL-XL) 25 MG 24 hr tablet    5. Essential hypertension  I10 lisinopril (ZESTRIL) 20 MG tablet       CHA2DS2-VASc Score is 4.  Yearly risk of stroke: 4.8% (A, F,  HTN).  Score of 1=0.6; 2=2.2; 3=3.2; 4=4.8; 5=7.2; 6=9.8; 7=>9.8) -(CHF; HTN; vasc disease DM,  Female = 1; Age <65 =0; 65-74 = 1,  >75 =2; stroke/embolism= 2).    Meds ordered this encounter  Medications   metoprolol succinate (TOPROL-XL) 25 MG 24 hr tablet    Sig: Take 1 tablet (25 mg total) by mouth daily.    Dispense:  100 tablet    Refill:  3    This  prescription was filled on 07/12/2021. Any refills authorized will be placed on file.   lisinopril (ZESTRIL) 20 MG tablet    Sig: Take 1 tablet (20 mg total) by mouth daily.    Dispense:  100 tablet    Refill:  3    This prescription was filled on 07/12/2021. Any refills authorized will be placed on file.   Medications Discontinued During This Encounter  Medication Reason   lisinopril (ZESTRIL) 20 MG tablet Reorder   metoprolol succinate (TOPROL-XL) 25 MG 24 hr tablet Reorder     Recommendations:   Alexa Hansen is a  75 y.o.  with  paroxysmal atrial flutter status post RFA 05/2017 by Dr. Lovena Le, she had developed A. Fib during A. Flutter ablation by Dr. Lovena Le and hence on long term anticoagulation.  She also has had an episode of atrial fibrillation on 03/24/2021 when she presented to the emergency room and spontaneously converted to sinus rhythm.  Past medical history significant for hypertension and whitecoat hypertension.  She essentially remains asymptomatic and is tolerating the medications well.  Brings home blood pressure recordings which are under excellent control.  She has not had any recurrence of atrial fibrillation, she is presently on anticoagulation as per patient preference and also after discussions with Dr. [name] from EP.  She has not had any bleeding diathesis on Eliquis.  No changes in the medications.  I will see her back on annual basis.   Adrian Prows, MD, [facility] 11/07/2021, 4:13 PM Office: [phone] Pager: [phone]

## 2021-11-29 ENCOUNTER — Ambulatory Visit: Payer: Medicare HMO | Admitting: Cardiology

## 2021-12-22 IMAGING — DX DG CHEST 1V PORT
1 series · 1 of 1 positions shown · non-contrast
Comparison: 06/04/2017

CLINICAL DATA: Chest pain

EXAM:
PORTABLE CHEST 1 VIEW

[chest]
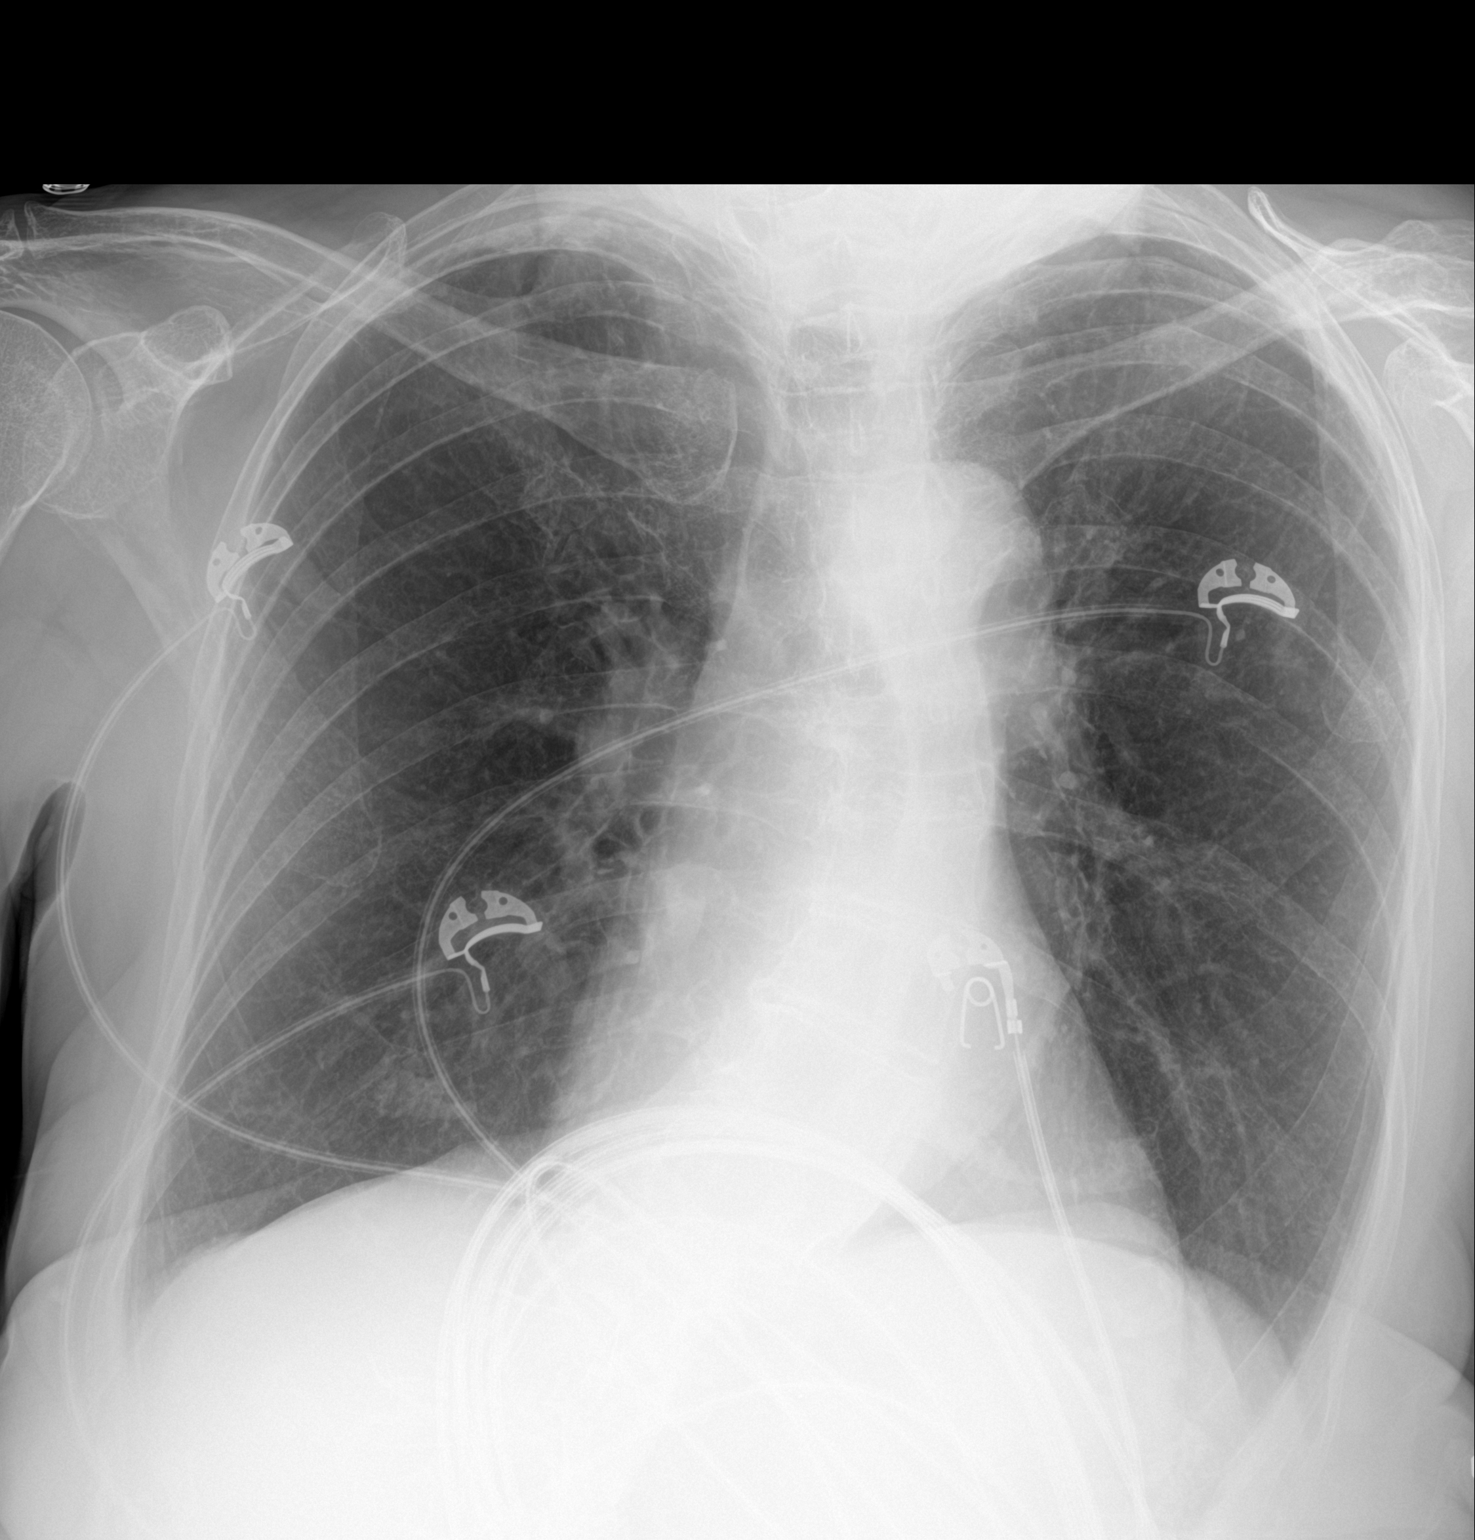

[1 of 1 positions shown; findings below may reference images not displayed]

FINDINGS: No focal opacity or pleural effusion. Stable cardiomediastinal
silhouette. No pneumothorax.
IMPRESSION: No active disease.

## 2022-01-01 DIAGNOSIS — M810 Age-related osteoporosis without current pathological fracture: Secondary | ICD-10-CM | POA: Diagnosis not present

## 2022-01-01 DIAGNOSIS — I10 Essential (primary) hypertension: Secondary | ICD-10-CM | POA: Diagnosis not present

## 2022-01-01 DIAGNOSIS — E7800 Pure hypercholesterolemia, unspecified: Secondary | ICD-10-CM | POA: Diagnosis not present

## 2022-01-01 DIAGNOSIS — I4891 Unspecified atrial fibrillation: Secondary | ICD-10-CM | POA: Diagnosis not present

## 2022-01-08 ENCOUNTER — Other Ambulatory Visit: Payer: Self-pay | Admitting: Cardiology

## 2022-01-08 DIAGNOSIS — I480 Paroxysmal atrial fibrillation: Secondary | ICD-10-CM

## 2022-01-15 DIAGNOSIS — Z780 Asymptomatic menopausal state: Secondary | ICD-10-CM | POA: Diagnosis not present

## 2022-01-15 DIAGNOSIS — M85852 Other specified disorders of bone density and structure, left thigh: Secondary | ICD-10-CM | POA: Diagnosis not present

## 2022-01-15 DIAGNOSIS — M810 Age-related osteoporosis without current pathological fracture: Secondary | ICD-10-CM | POA: Diagnosis not present

## 2022-03-21 DIAGNOSIS — Z0000 Encounter for general adult medical examination without abnormal findings: Secondary | ICD-10-CM | POA: Diagnosis not present

## 2022-03-21 DIAGNOSIS — D6869 Other thrombophilia: Secondary | ICD-10-CM | POA: Diagnosis not present

## 2022-03-21 DIAGNOSIS — E7800 Pure hypercholesterolemia, unspecified: Secondary | ICD-10-CM | POA: Diagnosis not present

## 2022-03-21 DIAGNOSIS — M810 Age-related osteoporosis without current pathological fracture: Secondary | ICD-10-CM | POA: Diagnosis not present

## 2022-03-21 DIAGNOSIS — Z853 Personal history of malignant neoplasm of breast: Secondary | ICD-10-CM | POA: Diagnosis not present

## 2022-03-21 DIAGNOSIS — Z1389 Encounter for screening for other disorder: Secondary | ICD-10-CM | POA: Diagnosis not present

## 2022-03-21 DIAGNOSIS — I4891 Unspecified atrial fibrillation: Secondary | ICD-10-CM | POA: Diagnosis not present

## 2022-03-21 DIAGNOSIS — I10 Essential (primary) hypertension: Secondary | ICD-10-CM | POA: Diagnosis not present

## 2022-03-27 NOTE — Progress Notes (Signed)
Labs 03/21/2022: ? ?Hb 12.8/HCT 37.8, platelets 233, normal indicis. ? ?Serum glucose 96 mg, BUN 14, creatinine 0.66, EGFR 91 mL, potassium 4.8, LFTs normal. ? ?Total cholesterol 192, triglycerides 81, HDL 68, LDL 110.  Non-HDL cholesterol 124.

## 2022-04-08 ENCOUNTER — Other Ambulatory Visit: Payer: Self-pay | Admitting: Cardiology

## 2022-04-08 DIAGNOSIS — I10 Essential (primary) hypertension: Secondary | ICD-10-CM

## 2022-04-10 DIAGNOSIS — M810 Age-related osteoporosis without current pathological fracture: Secondary | ICD-10-CM | POA: Diagnosis not present

## 2022-07-15 ENCOUNTER — Other Ambulatory Visit: Payer: Self-pay | Admitting: Cardiology

## 2022-07-15 DIAGNOSIS — I480 Paroxysmal atrial fibrillation: Secondary | ICD-10-CM

## 2022-07-16 NOTE — Telephone Encounter (Signed)
Refill request

## 2022-07-19 DIAGNOSIS — Z1231 Encounter for screening mammogram for malignant neoplasm of breast: Secondary | ICD-10-CM | POA: Diagnosis not present

## 2022-09-17 DIAGNOSIS — L578 Other skin changes due to chronic exposure to nonionizing radiation: Secondary | ICD-10-CM | POA: Diagnosis not present

## 2022-09-17 DIAGNOSIS — L570 Actinic keratosis: Secondary | ICD-10-CM | POA: Diagnosis not present

## 2022-09-17 DIAGNOSIS — L821 Other seborrheic keratosis: Secondary | ICD-10-CM | POA: Diagnosis not present

## 2022-09-17 DIAGNOSIS — D1801 Hemangioma of skin and subcutaneous tissue: Secondary | ICD-10-CM | POA: Diagnosis not present

## 2022-09-17 DIAGNOSIS — L814 Other melanin hyperpigmentation: Secondary | ICD-10-CM | POA: Diagnosis not present

## 2022-09-18 ENCOUNTER — Telehealth: Payer: Self-pay

## 2022-09-18 NOTE — Patient Outreach (Signed)
  Care Coordination   Initial Visit Note   09/18/2022 Name: Alexa STEGMANN MRN: [id number] DOB: December 19, 1945  Alexa Hansen is a 76 y.o. year old female who sees Kathyrn Lass, MD for primary care. I spoke with  Claudie Revering by phone today.  What matters to the patients health and wellness today?  Health Maintenance   Goals Addressed             This Visit's Progress    Care Coordination Activities       Care Coordination Interventions: Reviewed plan for disease management. Reports adhering to plan for HTN and Atrial Flutter as outlined by the Cardiology team. Reports systolic reading have ranged from 114 to 125. Reports diastolic readings have ranged in the 50's to mid 60's. Advised to monitor and record as advised. Advised to notify the Cardiology team for readings outside of the established range.  Reviewed medications. Reports managing well. Denies concerns r/t medication management or prescription cost. Discussed RSV vaccine. Will follow up with the Cardiology team to discuss risks and recommendations. Assessed social determinant of health barriers. AWV up to date. Completed on 03/21/22.         SDOH assessments and interventions completed:  Yes  SDOH Interventions Today    Flowsheet Row Most Recent Value  SDOH Interventions   Food Insecurity Interventions Intervention Not Indicated  Transportation Interventions Intervention Not Indicated        Care Coordination Interventions Activated:  Yes  Care Coordination Interventions:  Yes, provided   Follow up plan:  Will follow up within the next week regarding vaccine recommendations.    Encounter Outcome:  Pt. Visit Completed   [facility] [phone]

## 2022-09-18 NOTE — Patient Instructions (Signed)
Visit Information  Thank you for allowing the Care Management team to participate in your care. It was great speaking with you today!  Following are the goals we discussed today:   Goals Addressed             This Visit's Progress    Care Coordination Activities       Care Coordination Interventions: Reviewed plan for disease management. Reports adhering to plan for HTN and Atrial Flutter as outlined by the Cardiology team. Reports systolic reading have ranged from 114 to 125. Reports diastolic readings have ranged in the 50's to mid 60's. Advised to monitor and record as advised. Advised to notify the Cardiology team for readings outside of the established range.  Reviewed medications. Reports managing well. Denies concerns r/t medication management or prescription cost. Discussed RSV vaccine. Will follow up with the Cardiology team to discuss risks and recommendations. Assessed social determinant of health barriers. AWV up to date. Completed on 03/21/22.         Alexa Hansen verbalized understanding of the information discussed during the telephonic outreach. Declined need for mailed instructions or resources.  Will follow up within the next week regarding vaccine.  [facility] [phone]

## 2022-10-09 DIAGNOSIS — H524 Presbyopia: Secondary | ICD-10-CM | POA: Diagnosis not present

## 2022-10-09 DIAGNOSIS — H35033 Hypertensive retinopathy, bilateral: Secondary | ICD-10-CM | POA: Diagnosis not present

## 2022-10-11 DIAGNOSIS — M810 Age-related osteoporosis without current pathological fracture: Secondary | ICD-10-CM | POA: Diagnosis not present

## 2022-10-14 ENCOUNTER — Other Ambulatory Visit: Payer: Self-pay | Admitting: Cardiology

## 2022-10-14 DIAGNOSIS — I480 Paroxysmal atrial fibrillation: Secondary | ICD-10-CM

## 2022-10-14 DIAGNOSIS — I10 Essential (primary) hypertension: Secondary | ICD-10-CM

## 2022-10-18 DIAGNOSIS — Z79620 Long term (current) use of immunosuppressive biologic: Secondary | ICD-10-CM | POA: Diagnosis not present

## 2022-10-18 DIAGNOSIS — Z853 Personal history of malignant neoplasm of breast: Secondary | ICD-10-CM | POA: Diagnosis not present

## 2022-10-18 DIAGNOSIS — I4891 Unspecified atrial fibrillation: Secondary | ICD-10-CM | POA: Diagnosis not present

## 2022-10-18 DIAGNOSIS — Z809 Family history of malignant neoplasm, unspecified: Secondary | ICD-10-CM | POA: Diagnosis not present

## 2022-10-18 DIAGNOSIS — G8929 Other chronic pain: Secondary | ICD-10-CM | POA: Diagnosis not present

## 2022-10-18 DIAGNOSIS — Y842 Radiological procedure and radiotherapy as the cause of abnormal reaction of the patient, or of later complication, without mention of misadventure at the time of the procedure: Secondary | ICD-10-CM | POA: Diagnosis not present

## 2022-10-18 DIAGNOSIS — I10 Essential (primary) hypertension: Secondary | ICD-10-CM | POA: Diagnosis not present

## 2022-10-18 DIAGNOSIS — D6869 Other thrombophilia: Secondary | ICD-10-CM | POA: Diagnosis not present

## 2022-10-18 DIAGNOSIS — I739 Peripheral vascular disease, unspecified: Secondary | ICD-10-CM | POA: Diagnosis not present

## 2022-10-18 DIAGNOSIS — Z7901 Long term (current) use of anticoagulants: Secondary | ICD-10-CM | POA: Diagnosis not present

## 2022-10-18 DIAGNOSIS — M810 Age-related osteoporosis without current pathological fracture: Secondary | ICD-10-CM | POA: Diagnosis not present

## 2022-10-18 DIAGNOSIS — Z008 Encounter for other general examination: Secondary | ICD-10-CM | POA: Diagnosis not present

## 2022-10-18 DIAGNOSIS — D84822 Immunodeficiency due to external causes: Secondary | ICD-10-CM | POA: Diagnosis not present

## 2022-11-05 ENCOUNTER — Ambulatory Visit: Payer: Medicare HMO | Admitting: Cardiology

## 2022-11-05 ENCOUNTER — Encounter: Payer: Self-pay | Admitting: Cardiology

## 2022-11-05 VITALS — BP 136/72 | HR 62 | Temp 98.6°F | Resp 16 | Ht 63.0 in | Wt 137.0 lb

## 2022-11-05 DIAGNOSIS — I480 Paroxysmal atrial fibrillation: Secondary | ICD-10-CM | POA: Diagnosis not present

## 2022-11-05 DIAGNOSIS — I10 Essential (primary) hypertension: Secondary | ICD-10-CM

## 2022-11-05 DIAGNOSIS — Z9889 Other specified postprocedural states: Secondary | ICD-10-CM | POA: Diagnosis not present

## 2022-11-05 NOTE — Progress Notes (Signed)
Primary Physician/Referring:  Kathyrn Lass, MD  Patient ID: Alexa Hansen, female    DOB: 1946/04/12, 76 y.o.   MRN: [id number]  Chief Complaint  Patient presents with   Atrial Fibrillation   Hypertension   Follow-up    1 year    HPI: Alexa Hansen  is a 76 y.o. female with  paroxysmal atrial flutter status post RFA 05/2017 by Dr. Lovena Le, she had developed A. Fib during A. Flutter ablation by Dr. Lovena Le and hence on long term anticoagulation.  She also has had an episode of atrial fibrillation on 03/24/2021 when she presented to the emergency room and spontaneously converted to sinus rhythm.  Past medical history significant for hypertension and whitecoat hypertension.  She presents today for 6 month follow-up. Overall, she doing well without any complaints.  She remains active in the [employer] program 4 times a week. She denies chest pain, shortness of breath, palpitations, leg edema, claudication, orthopnea, PND, syncope.  Past Medical History:  Diagnosis Date   Atrial flutter ([place])    Breast cancer ([place])    Hypertension    Syncope     Past Surgical History:  Procedure Laterality Date   A-FLUTTER ABLATION N/A 06/12/2017   Procedure: A-Flutter Ablation;  Surgeon: Evans Lance, MD;  Location: [facility];  Service: Cardiovascular;  Laterality: N/A;   BREAST SURGERY     MOUTH SURGERY     to remove torus   TONSILLECTOMY     VEIN LIGATION     Dr Deon Pilling   Social History   Tobacco Use   Smoking status: Never   Smokeless tobacco: Never  Substance Use Topics   Alcohol use: No  Marital Status: Married    Review of Systems  Cardiovascular:  Negative for chest pain, claudication, dyspnea on exertion, leg swelling, near-syncope, palpitations and syncope.  Gastrointestinal:  Negative for melena.   Objective  Blood pressure 136/72, pulse 62, temperature 98.6 F (37 C), temperature source Temporal, resp. rate 16, height _0  (1.6 m), weight 137 lb (62.1 kg), SpO2  99 %. Body mass index is 24.27 kg/m.     11/05/2022    3:16 PM 11/05/2022    2:43 PM 11/07/2021    3:43 PM  Vitals with BMI  Height  _1    Weight  137 lbs   BMI  26.83   Systolic 419 622 297  Diastolic 72 74 64  Pulse  62 60     Physical Exam Neck:     Vascular: No carotid bruit or JVD.  Cardiovascular:     Rate and Rhythm: Normal rate and regular rhythm.     Pulses: Intact distal pulses.          Carotid pulses are 2+ on the right side and 2+ on the left side.      Radial pulses are 2+ on the right side and 2+ on the left side.       Dorsalis pedis pulses are 2+ on the right side and 2+ on the left side.       Posterior tibial pulses are 1+ on the right side and 1+ on the left side.     Heart sounds: Normal heart sounds. No murmur heard.    No gallop.  Pulmonary:     Effort: Pulmonary effort is normal.     Breath sounds: Normal breath sounds.  Abdominal:     General: Bowel sounds are normal.     Palpations: Abdomen  is soft.  Musculoskeletal:     Right lower leg: No edema.     Left lower leg: No edema.    Radiology: No results found.  Laboratory examination:   External labs:  Labs 03/21/2022:   Hb 12.8/HCT 37.8, platelets 233, normal indicis.   Serum glucose 96 mg, BUN 14, creatinine 0.66, EGFR 91 mL, potassium 4.8, LFTs normal.   Total cholesterol 192, triglycerides 81, HDL 68, LDL 110.  Non-HDL cholesterol 124.   Current Outpatient Medications on File Prior to Visit  Medication Sig Dispense Refill   Biotin 1000 MCG tablet Take 1,000 mcg by mouth in the morning and at bedtime.     Calcium Carb-Cholecalciferol (CALCIUM+D3 PO) Take 1 tablet by mouth in the morning and at bedtime.     cholecalciferol (VITAMIN D3) 25 MCG (1000 UNIT) tablet Take 1,000 Units by mouth in the morning.     denosumab (PROLIA) 60 MG/ML SOSY injection Inject 60 mg into the skin every 6 (six) months.     ELIQUIS 5 MG TABS tablet TAKE ONE TABLET BY MOUTH AT BREAKFAST AND AT BEDTIME 180  tablet 3   flecainide (TAMBOCOR) 150 MG tablet TAKE 1/2 TABLET BY MOUTH AT BREAKFAST AND AT BEDTIME 100 tablet 3   lisinopril (ZESTRIL) 20 MG tablet TAKE ONE TABLET BY MOUTH EVERYDAY AT BEDTIME 100 tablet 1   metoprolol succinate (TOPROL-XL) 25 MG 24 hr tablet TAKE ONE TABLET BY MOUTH EVERYDAY AT BEDTIME 90 tablet 3   acetaminophen (TYLENOL) 500 MG tablet Take 500 mg by mouth every 6 (six) hours as needed for mild pain (or headaches).     No current facility-administered medications on file prior to visit.     Cardiac Studies:   Treadmill stress test 07-21-2017 on Flecainide-Dr [name]: Normal without inducible arrhythmias.  Echocardiogram 05/08/2021:  Normal LV systolic function with EF 62%. Left ventricle cavity is normal in size. Normal left ventricular wall thickness. Normal global wall motion. Normal diastolic filling pattern. Calculated EF 62%. Left atrial cavity is moderate to severely dilated at 4.2 cm. LA size stated with volume index calculation. Trileaflet aortic valve. No evidence of aortic stenosis. Trace aortic regurgitation. Structurally normal mitral valve. No evidence of mitral stenosis. Mild (Grade I) mitral regurgitation. Structurally normal tricuspid valve. No evidence of tricuspid stenosis. Mild tricuspid regurgitation. No evidence of pulmonary hypertension. No significant change from 12/06/2016.   EKG:  EKG 11/05/2022: Normal sinus rhythm at rate of 60 bpm.  Normal axis.  Left atrial enlargement.  Incomplete right bundle branch block.  Compared to previous EKG on 11/07/2021, no significant change.  Assessment     ICD-10-CM   1. Paroxysmal atrial fibrillation (HCC)  I48.0 EKG 12-Lead    2. White coat syndrome with diagnosis of hypertension  I10     3. Essential hypertension  I10     4. S/P ablation of atrial flutter 06/12/2017  Z98.890    Z86.79        CHA2DS2-VASc Score is 4.  Yearly risk of stroke: 4.8% (A, F,  HTN).  Score of 1=0.6; 2=2.2; 3=3.2;  4=4.8; 5=7.2; 6=9.8; 7=>9.8) -(CHF; HTN; vasc disease DM,  Female = 1; Age <65 =0; 65-74 = 1,  >75 =2; stroke/embolism= 2).    No orders of the defined types were placed in this encounter.  There are no discontinued medications.    Recommendations:   Alexa Hansen is a 76 y.o.  with  paroxysmal atrial flutter status post RFA 05/2017 by Dr. Lovena Le,  she had developed A. Fib during A. Flutter ablation by Dr. Lovena Le and hence on long term anticoagulation.  She also has had an episode of atrial fibrillation on 03/24/2021 when she presented to the emergency room and spontaneously converted to sinus rhythm.  Past medical history significant for hypertension and whitecoat hypertension.  Paroxysmal atrial fibrillation (HCC) S/P ablation of atrial flutter 06/12/2017 She is currently maintaining sinus rhythm.  No recurrence of A-fib. Rate control: Metoprolol 25 mg daily. Rhythm control: Flecainide 75 mg twice daily. Is tolerating Eliquis 5 mg twice daily without any bleeding diathesis.  She is on anticoagulation as per patient preference and also after discussions with Dr. [name] from EP.  White coat syndrome with diagnosis of hypertension Essential hypertension Initial blood pressure elevated at today's visit however manual blood pressure check controlled. She does monitor her blood pressure at home and brings a list of home readings that are under excellent control. Current medications without changes.  Independently reviewed and interpreted external labs from 03/21/2022, kidney function and electrolytes are normal.  LDL 110, HDL 68.  Not currently on statin therapy.  Follow-up in 1 year or sooner if needed.   Ernst Spell, AGNP-C 11/05/2022, 3:20 PM Office: [phone] Pager: [phone]

## 2023-01-01 DIAGNOSIS — H25043 Posterior subcapsular polar age-related cataract, bilateral: Secondary | ICD-10-CM | POA: Diagnosis not present

## 2023-01-01 DIAGNOSIS — H2511 Age-related nuclear cataract, right eye: Secondary | ICD-10-CM | POA: Diagnosis not present

## 2023-01-01 DIAGNOSIS — H18413 Arcus senilis, bilateral: Secondary | ICD-10-CM | POA: Diagnosis not present

## 2023-01-01 DIAGNOSIS — H35371 Puckering of macula, right eye: Secondary | ICD-10-CM | POA: Diagnosis not present

## 2023-01-01 DIAGNOSIS — H2513 Age-related nuclear cataract, bilateral: Secondary | ICD-10-CM | POA: Diagnosis not present

## 2023-02-18 DIAGNOSIS — L649 Androgenic alopecia, unspecified: Secondary | ICD-10-CM | POA: Diagnosis not present

## 2023-03-18 DIAGNOSIS — H52201 Unspecified astigmatism, right eye: Secondary | ICD-10-CM | POA: Diagnosis not present

## 2023-03-18 DIAGNOSIS — H2513 Age-related nuclear cataract, bilateral: Secondary | ICD-10-CM | POA: Diagnosis not present

## 2023-03-18 DIAGNOSIS — H2511 Age-related nuclear cataract, right eye: Secondary | ICD-10-CM | POA: Diagnosis not present

## 2023-03-19 DIAGNOSIS — H2512 Age-related nuclear cataract, left eye: Secondary | ICD-10-CM | POA: Diagnosis not present

## 2023-03-26 DIAGNOSIS — Z6825 Body mass index (BMI) 25.0-25.9, adult: Secondary | ICD-10-CM | POA: Diagnosis not present

## 2023-03-26 DIAGNOSIS — Z0000 Encounter for general adult medical examination without abnormal findings: Secondary | ICD-10-CM | POA: Diagnosis not present

## 2023-03-26 DIAGNOSIS — Z1389 Encounter for screening for other disorder: Secondary | ICD-10-CM | POA: Diagnosis not present

## 2023-03-26 DIAGNOSIS — I10 Essential (primary) hypertension: Secondary | ICD-10-CM | POA: Diagnosis not present

## 2023-03-26 DIAGNOSIS — Z9181 History of falling: Secondary | ICD-10-CM | POA: Diagnosis not present

## 2023-04-01 DIAGNOSIS — H52202 Unspecified astigmatism, left eye: Secondary | ICD-10-CM | POA: Diagnosis not present

## 2023-04-01 DIAGNOSIS — H2512 Age-related nuclear cataract, left eye: Secondary | ICD-10-CM | POA: Diagnosis not present

## 2023-04-01 DIAGNOSIS — H2513 Age-related nuclear cataract, bilateral: Secondary | ICD-10-CM | POA: Diagnosis not present

## 2023-04-08 DIAGNOSIS — E871 Hypo-osmolality and hyponatremia: Secondary | ICD-10-CM | POA: Diagnosis not present

## 2023-04-08 DIAGNOSIS — E875 Hyperkalemia: Secondary | ICD-10-CM | POA: Diagnosis not present

## 2023-04-08 DIAGNOSIS — D6869 Other thrombophilia: Secondary | ICD-10-CM | POA: Diagnosis not present

## 2023-04-08 DIAGNOSIS — I4891 Unspecified atrial fibrillation: Secondary | ICD-10-CM | POA: Diagnosis not present

## 2023-04-08 DIAGNOSIS — I10 Essential (primary) hypertension: Secondary | ICD-10-CM | POA: Diagnosis not present

## 2023-04-08 DIAGNOSIS — M810 Age-related osteoporosis without current pathological fracture: Secondary | ICD-10-CM | POA: Diagnosis not present

## 2023-04-08 DIAGNOSIS — I739 Peripheral vascular disease, unspecified: Secondary | ICD-10-CM | POA: Diagnosis not present

## 2023-04-11 DIAGNOSIS — L570 Actinic keratosis: Secondary | ICD-10-CM | POA: Diagnosis not present

## 2023-04-11 DIAGNOSIS — D1801 Hemangioma of skin and subcutaneous tissue: Secondary | ICD-10-CM | POA: Diagnosis not present

## 2023-04-16 DIAGNOSIS — M810 Age-related osteoporosis without current pathological fracture: Secondary | ICD-10-CM | POA: Diagnosis not present

## 2023-04-23 DIAGNOSIS — Z0100 Encounter for examination of eyes and vision without abnormal findings: Secondary | ICD-10-CM | POA: Diagnosis not present

## 2023-05-20 ENCOUNTER — Other Ambulatory Visit: Payer: Self-pay | Admitting: Cardiology

## 2023-05-20 DIAGNOSIS — I10 Essential (primary) hypertension: Secondary | ICD-10-CM

## 2023-05-20 DIAGNOSIS — I480 Paroxysmal atrial fibrillation: Secondary | ICD-10-CM

## 2023-05-21 NOTE — Telephone Encounter (Signed)
Can I refill flecainide? 

## 2023-08-07 DIAGNOSIS — Z1231 Encounter for screening mammogram for malignant neoplasm of breast: Secondary | ICD-10-CM | POA: Diagnosis not present

## 2023-08-12 DIAGNOSIS — M25511 Pain in right shoulder: Secondary | ICD-10-CM | POA: Diagnosis not present

## 2023-08-12 DIAGNOSIS — Z6825 Body mass index (BMI) 25.0-25.9, adult: Secondary | ICD-10-CM | POA: Diagnosis not present

## 2023-08-12 DIAGNOSIS — I4891 Unspecified atrial fibrillation: Secondary | ICD-10-CM | POA: Diagnosis not present

## 2023-08-12 DIAGNOSIS — M79601 Pain in right arm: Secondary | ICD-10-CM | POA: Diagnosis not present

## 2023-08-12 DIAGNOSIS — M79602 Pain in left arm: Secondary | ICD-10-CM | POA: Diagnosis not present

## 2023-08-12 DIAGNOSIS — M19011 Primary osteoarthritis, right shoulder: Secondary | ICD-10-CM | POA: Diagnosis not present

## 2023-08-16 DIAGNOSIS — I10 Essential (primary) hypertension: Secondary | ICD-10-CM | POA: Diagnosis not present

## 2023-08-16 DIAGNOSIS — D6869 Other thrombophilia: Secondary | ICD-10-CM | POA: Diagnosis not present

## 2023-08-16 DIAGNOSIS — M810 Age-related osteoporosis without current pathological fracture: Secondary | ICD-10-CM | POA: Diagnosis not present

## 2023-08-16 DIAGNOSIS — Z7901 Long term (current) use of anticoagulants: Secondary | ICD-10-CM | POA: Diagnosis not present

## 2023-08-16 DIAGNOSIS — G8929 Other chronic pain: Secondary | ICD-10-CM | POA: Diagnosis not present

## 2023-08-16 DIAGNOSIS — I4891 Unspecified atrial fibrillation: Secondary | ICD-10-CM | POA: Diagnosis not present

## 2023-08-16 DIAGNOSIS — Z79620 Long term (current) use of immunosuppressive biologic: Secondary | ICD-10-CM | POA: Diagnosis not present

## 2023-08-21 ENCOUNTER — Other Ambulatory Visit: Payer: Self-pay

## 2023-08-21 DIAGNOSIS — I10 Essential (primary) hypertension: Secondary | ICD-10-CM

## 2023-08-21 DIAGNOSIS — I480 Paroxysmal atrial fibrillation: Secondary | ICD-10-CM

## 2023-08-21 MED ORDER — FLECAINIDE ACETATE 150 MG PO TABS
75.0000 mg | ORAL_TABLET | Freq: Two times a day (BID) | ORAL | 3 refills | Status: DC
Start: 2023-08-21 — End: 2024-11-09

## 2023-08-21 MED ORDER — APIXABAN 5 MG PO TABS: 5.0000 mg | ORAL_TABLET | Freq: Two times a day (BID) | ORAL | 3 refills | Status: DC

## 2023-08-21 MED ORDER — METOPROLOL SUCCINATE ER 25 MG PO TB24: 25.0000 mg | ORAL_TABLET | Freq: Every day | ORAL | 3 refills | Status: DC

## 2023-08-21 MED ORDER — LISINOPRIL 20 MG PO TABS: 20.0000 mg | ORAL_TABLET | Freq: Every day | ORAL | 3 refills | Status: DC

## 2023-09-23 DIAGNOSIS — L821 Other seborrheic keratosis: Secondary | ICD-10-CM | POA: Diagnosis not present

## 2023-09-23 DIAGNOSIS — L814 Other melanin hyperpigmentation: Secondary | ICD-10-CM | POA: Diagnosis not present

## 2023-09-23 DIAGNOSIS — L570 Actinic keratosis: Secondary | ICD-10-CM | POA: Diagnosis not present

## 2023-09-23 DIAGNOSIS — D1801 Hemangioma of skin and subcutaneous tissue: Secondary | ICD-10-CM | POA: Diagnosis not present

## 2023-09-23 DIAGNOSIS — L578 Other skin changes due to chronic exposure to nonionizing radiation: Secondary | ICD-10-CM | POA: Diagnosis not present

## 2023-11-07 ENCOUNTER — Ambulatory Visit: Payer: Self-pay | Admitting: Cardiology

## 2023-11-25 ENCOUNTER — Other Ambulatory Visit: Payer: Self-pay

## 2023-11-25 DIAGNOSIS — I480 Paroxysmal atrial fibrillation: Secondary | ICD-10-CM

## 2023-11-25 MED ORDER — APIXABAN 5 MG PO TABS
5.0000 mg | ORAL_TABLET | Freq: Two times a day (BID) | ORAL | 0 refills | Status: DC
Start: 2023-11-25 — End: 2024-01-17

## 2023-11-25 NOTE — Telephone Encounter (Signed)
Prescription refill request for Eliquis received. Indication:afib Last office visit:upcoming ZHY:QMVHQ labs Age: 77 Weight:62.1  kg  Prescription refilled

## 2023-11-27 ENCOUNTER — Telehealth: Payer: Self-pay | Admitting: Cardiology

## 2023-11-27 MED ORDER — APIXABAN 5 MG PO TABS: 5.0000 mg | ORAL_TABLET | Freq: Two times a day (BID) | ORAL | Status: DC

## 2023-11-27 NOTE — Telephone Encounter (Signed)
Spoke with the patient who states that she is out of Eliquis. She is about to change insurances and would like some samples until then. Will provide patient with two weeks of samples.

## 2023-11-27 NOTE — Telephone Encounter (Signed)
Patient calling the office for samples of medication:   1.  What medication and dosage are you requesting samples for? apixaban (ELIQUIS) 5 MG TABS tablet  2.  Are you currently out of this medication? Yes    

## 2024-01-17 ENCOUNTER — Encounter: Payer: Self-pay | Admitting: Cardiology

## 2024-01-17 ENCOUNTER — Ambulatory Visit: Payer: Medicare HMO | Attending: Cardiology | Admitting: Cardiology

## 2024-01-17 VITALS — BP 118/74 | HR 63 | Resp 16 | Ht 63.0 in | Wt 136.2 lb

## 2024-01-17 DIAGNOSIS — I480 Paroxysmal atrial fibrillation: Secondary | ICD-10-CM | POA: Diagnosis not present

## 2024-01-17 DIAGNOSIS — Z79899 Other long term (current) drug therapy: Secondary | ICD-10-CM

## 2024-01-17 DIAGNOSIS — I10 Essential (primary) hypertension: Secondary | ICD-10-CM | POA: Diagnosis not present

## 2024-01-17 MED ORDER — APIXABAN 5 MG PO TABS: 5.0000 mg | ORAL_TABLET | Freq: Two times a day (BID) | ORAL | 1 refills | Status: DC

## 2024-01-17 NOTE — Progress Notes (Unsigned)
Cardiology Office Note:  .   Date:  01/18/2024  ID:  Alexa Hansen, DOB 11-08-1946, MRN [id number] PCP: Sigmund Hazel, MD  [facility] Providers Cardiologist:  Yates Decamp, MD   History of Present Illness: .   Alexa Hansen is a 78 y.o. female with  paroxysmal atrial flutter status post RFA 05/2017 by Dr. Ladona Ridgel, she had developed A. Fib during A. Flutter ablation by Dr. Ladona Ridgel and hence on long term anticoagulation.  She also has had an episode of atrial fibrillation on 03/24/2021 when she presented to the emergency room and spontaneously converted to sinus rhythm.  She is presently on flecainide 75 mg twice daily long-term.  Past medical history significant for hypertension and whitecoat hypertension   Discussed the use of AI scribe software for clinical note transcription with the patient, who gave verbal consent to proceed.  History of Present Illness   The patient presents with a recent fainting episode and for cardiology follow-up regarding medication management. Her husband was present during the fainting episode and assisted her.  Earlier this month, she experienced a fainting episode while in the shower. She felt faint and attempted to sit on the toilet but fell before reaching it. This has happened once before under similar circumstances, involving a long, hot shower. No chest pain or shortness of breath has been experienced since the incident, and there have been no further episodes.  Otherwise remains asymptomatic.   Labs   External Labs:  Labs 03/21/2022:  Hb 12.8/HCT 37.8, platelets 233, normal indicis.  Serum glucose 96 mg, BUN 14, creatinine 0.66, EGFR 91 mL, potassium 4.8, LFTs normal.  Total cholesterol 192, triglycerides 81, HDL 68, LDL 110.  Non-HDL cholesterol 124.  Review of Systems  Cardiovascular:  Negative for chest pain, dyspnea on exertion and leg swelling.   Physical Exam:   VS:  BP 118/74 (BP Location: Left Arm, Patient Position: Sitting, Cuff Size:  Normal)   Pulse 63   Resp 16   Ht 5\' 3"  (1.6 m)   Wt 136 lb 3.2 oz (61.8 kg)   SpO2 98%   BMI 24.13 kg/m    Wt Readings from Last 3 Encounters:  01/17/24 136 lb 3.2 oz (61.8 kg)  11/05/22 137 lb (62.1 kg)  11/07/21 136 lb 3.2 oz (61.8 kg)     Physical Exam Neck:     Vascular: No carotid bruit or JVD.  Cardiovascular:     Rate and Rhythm: Normal rate and regular rhythm.     Pulses: Intact distal pulses.     Heart sounds: Normal heart sounds. No murmur heard.    No gallop.  Pulmonary:     Effort: Pulmonary effort is normal.     Breath sounds: Normal breath sounds.  Abdominal:     General: Bowel sounds are normal.     Palpations: Abdomen is soft.  Musculoskeletal:     Right lower leg: No edema.     Left lower leg: No edema.     Studies Reviewed: .    Echocardiogram 05/08/2021: Normal LV systolic function with EF 62%. Left ventricle cavity is normal in size. Normal left ventricular wall thickness. Normal global wall motion. Normal diastolic filling pattern. Calculated EF 62%. Left atrial cavity is moderate to severely dilated at 4.2 cm. LA size stated with volume index calculation. Trileaflet aortic valve. No evidence of aortic stenosis. Trace aortic regurgitation. Structurally normal mitral valve. No evidence of mitral stenosis. Mild (Grade I) mitral regurgitation. Structurally normal tricuspid  valve. No evidence of tricuspid stenosis. Mild tricuspid regurgitation. No evidence of pulmonary hypertension. No significant change from 12/06/2016.  EKG:    EKG Interpretation Date/Time:  Friday January 17 2024 15:11:54 EST Ventricular Rate:  61 PR Interval:  154 QRS Duration:  102 QT Interval:  404 QTC Calculation: 406 R Axis:   -1  Text Interpretation: EKG 01/17/2024: Normal sinus rhythm with rate of 61 bpm, normal EKG. Confirmed by Delrae Rend [id number]) on 01/17/2024 3:21:20 PM    EKG 11/05/2022: Normal sinus rhythm at rate of 60 bpm. Normal axis. Left atrial  enlargement. Incomplete right bundle branch block   Medications and allergies    Allergies  Allergen Reactions   Alendronate Sodium     Other reaction(s): Osteonecrosis of jaw   Fosamax [Alendronate] Other (See Comments)    Osteonecrosis of jaw   Doxycycline Nausea Only     Current Outpatient Medications:    acetaminophen (TYLENOL) 500 MG tablet, Take 500 mg by mouth every 6 (six) hours as needed for moderate pain (pain score 4-6) (or headaches)., Disp: , Rfl:    Biotin 1000 MCG tablet, Take 1,000 mcg by mouth in the morning and at bedtime., Disp: , Rfl:    Calcium Carb-Cholecalciferol (CALCIUM+D3 PO), Take 1 tablet by mouth in the morning and at bedtime., Disp: , Rfl:    cholecalciferol (VITAMIN D3) 25 MCG (1000 UNIT) tablet, Take 1,000 Units by mouth in the morning., Disp: , Rfl:    DENTA 5000 PLUS 1.1 % CREA dental cream, Place 1 Application onto teeth at bedtime., Disp: , Rfl:    flecainide (TAMBOCOR) 150 MG tablet, Take 0.5 tablets (75 mg total) by mouth 2 (two) times daily., Disp: 100 tablet, Rfl: 3   lisinopril (ZESTRIL) 20 MG tablet, Take 1 tablet (20 mg total) by mouth daily., Disp: 100 tablet, Rfl: 3   metoprolol succinate (TOPROL-XL) 25 MG 24 hr tablet, Take 1 tablet (25 mg total) by mouth daily. (Patient taking differently: Take 25 mg by mouth at bedtime.), Disp: 90 tablet, Rfl: 3   apixaban (ELIQUIS) 5 MG TABS tablet, Take 1 tablet (5 mg total) by mouth 2 (two) times daily., Disp: 180 tablet, Rfl: 1   ASSESSMENT AND PLAN: .      ICD-10-CM   1. Paroxysmal atrial fibrillation (HCC).  I48.0 EKG 12-Lead    2. Essential hypertension  I10     3. High risk medication use  Z79.899      Click Here to Calculate/Change CHADS2VASc Score The patient's CHADS2-VASc score is 4, indicating a 4.8% annual risk of stroke.  Therefore, anticoagulation is recommended.   CHF History: No HTN History: Yes Diabetes History: No Stroke History: No Vascular Disease History: No     Assessment and Plan    Paroxysmal Atrial Fibrillation Well-controlled with flecainide 75 mg BID and Eliquis 5 mg BID. EKG normal, no signs of heart failure. Emphasized importance of current medications for rhythm control and thromboembolism prevention. - Continue flecainide 75 mg BID - Continue Eliquis 5 mg BID - Follow up in one year after PCP visit  Syncope Recent episode during hot shower, no recurrence. EKG normal, no signs of heart failure. Advised to lower shower temperature and sit post-shower to prevent future episodes.  Do not suspect arrhythmia as an etiology for her syncope.  QT interval normal.  2 episodes of syncope similarly after taking a long hot shower. - Advise to lower shower temperature and sit post-shower  Hypertension Well-controlled with current medication. Blood pressure  within normal limits. Emphasized importance of maintaining current regimen to prevent cardiovascular events. - Continue lisinopril 20 mg daily  Metoprolol-Induced Fatigue/drowsiness Patient would like to change to taking it at bedtime although it is very low-dose.  States that she tolerates this well as she can sleep well as well if she were to take it at night. - Change metoprolol administration to bedtime  General Health Maintenance Routine health maintenance under PCP care. Emphasized importance of regular follow-ups and lab work. - Continue annual visits with PCP - Schedule next cardiology visit after PCP visit in April 2026  Follow-up - Schedule follow-up with cardiologist in May 2025 after PCP visit in April 2025 - Ensure lab results from PCP are available for review at next cardiology visit.   Signed,  Yates Decamp, MD, [facility] 01/18/2024, 12:29 PM [facility] [address] [place] [address] [place], [place] [address] Phone: [phone]. Fax:  [phone]

## 2024-01-17 NOTE — Patient Instructions (Signed)
Medication Instructions:  Your physician recommends that you continue on your current medications as directed. Please refer to the Current Medication list given to you today.  *If you need a refill on your cardiac medications before your next appointment, please call your pharmacy*   Lab Work: None If you have labs (blood work) drawn today and your tests are completely normal, you will receive your results only by: MyChart Message (if you have MyChart) OR A paper copy in the mail If you have any lab test that is abnormal or we need to change your treatment, we will call you to review the results.   Testing/Procedures: none   Follow-Up: At [facility], you and your health needs are our priority.  As part of our continuing mission to provide you with exceptional heart care, we have created designated Provider Care Teams.  These Care Teams include your primary Cardiologist (physician) and Advanced Practice Providers (APPs -  Physician Assistants and Nurse Practitioners) who all work together to provide you with the care you need, when you need it.  We recommend signing up for the patient portal called "MyChart".  Sign up information is provided on this After Visit Summary.  MyChart is used to connect with patients for Virtual Visits (Telemedicine).  Patients are able to view lab/test results, encounter notes, upcoming appointments, etc.  Non-urgent messages can be sent to your provider as well.   To learn more about what you can do with MyChart, go to [email or web address].    Your next appointment:   May 2026  Provider:   Yates Decamp, MD     Other Instructions

## 2024-01-22 DIAGNOSIS — R29890 Loss of height: Secondary | ICD-10-CM | POA: Diagnosis not present

## 2024-01-22 DIAGNOSIS — M8588 Other specified disorders of bone density and structure, other site: Secondary | ICD-10-CM | POA: Diagnosis not present

## 2024-02-03 DIAGNOSIS — Z6825 Body mass index (BMI) 25.0-25.9, adult: Secondary | ICD-10-CM | POA: Diagnosis not present

## 2024-02-03 DIAGNOSIS — M810 Age-related osteoporosis without current pathological fracture: Secondary | ICD-10-CM | POA: Diagnosis not present

## 2024-02-28 DIAGNOSIS — S51012A Laceration without foreign body of left elbow, initial encounter: Secondary | ICD-10-CM | POA: Diagnosis not present

## 2024-03-02 DIAGNOSIS — H35033 Hypertensive retinopathy, bilateral: Secondary | ICD-10-CM | POA: Diagnosis not present

## 2024-03-02 DIAGNOSIS — H524 Presbyopia: Secondary | ICD-10-CM | POA: Diagnosis not present

## 2024-03-04 DIAGNOSIS — S51012D Laceration without foreign body of left elbow, subsequent encounter: Secondary | ICD-10-CM | POA: Diagnosis not present

## 2024-03-07 DIAGNOSIS — Z4802 Encounter for removal of sutures: Secondary | ICD-10-CM | POA: Diagnosis not present

## 2024-03-11 DIAGNOSIS — Z0100 Encounter for examination of eyes and vision without abnormal findings: Secondary | ICD-10-CM | POA: Diagnosis not present

## 2024-03-26 DIAGNOSIS — Z1331 Encounter for screening for depression: Secondary | ICD-10-CM | POA: Diagnosis not present

## 2024-03-26 DIAGNOSIS — Z0000 Encounter for general adult medical examination without abnormal findings: Secondary | ICD-10-CM | POA: Diagnosis not present

## 2024-03-26 DIAGNOSIS — Z6825 Body mass index (BMI) 25.0-25.9, adult: Secondary | ICD-10-CM | POA: Diagnosis not present

## 2024-03-31 DIAGNOSIS — I10 Essential (primary) hypertension: Secondary | ICD-10-CM | POA: Diagnosis not present

## 2024-03-31 DIAGNOSIS — I4891 Unspecified atrial fibrillation: Secondary | ICD-10-CM | POA: Diagnosis not present

## 2024-03-31 DIAGNOSIS — M810 Age-related osteoporosis without current pathological fracture: Secondary | ICD-10-CM | POA: Diagnosis not present

## 2024-03-31 DIAGNOSIS — Z6825 Body mass index (BMI) 25.0-25.9, adult: Secondary | ICD-10-CM | POA: Diagnosis not present

## 2024-06-18 DIAGNOSIS — I10 Essential (primary) hypertension: Secondary | ICD-10-CM | POA: Diagnosis not present

## 2024-06-18 DIAGNOSIS — I4891 Unspecified atrial fibrillation: Secondary | ICD-10-CM | POA: Diagnosis not present

## 2024-06-18 DIAGNOSIS — I739 Peripheral vascular disease, unspecified: Secondary | ICD-10-CM | POA: Diagnosis not present

## 2024-07-16 DIAGNOSIS — I4891 Unspecified atrial fibrillation: Secondary | ICD-10-CM | POA: Diagnosis not present

## 2024-07-16 DIAGNOSIS — M810 Age-related osteoporosis without current pathological fracture: Secondary | ICD-10-CM | POA: Diagnosis not present

## 2024-07-16 DIAGNOSIS — I10 Essential (primary) hypertension: Secondary | ICD-10-CM | POA: Diagnosis not present

## 2024-07-16 DIAGNOSIS — I739 Peripheral vascular disease, unspecified: Secondary | ICD-10-CM | POA: Diagnosis not present

## 2024-07-17 DIAGNOSIS — I10 Essential (primary) hypertension: Secondary | ICD-10-CM | POA: Diagnosis not present

## 2024-07-17 DIAGNOSIS — I4891 Unspecified atrial fibrillation: Secondary | ICD-10-CM | POA: Diagnosis not present

## 2024-07-17 DIAGNOSIS — I739 Peripheral vascular disease, unspecified: Secondary | ICD-10-CM | POA: Diagnosis not present

## 2024-07-22 ENCOUNTER — Other Ambulatory Visit: Payer: Self-pay | Admitting: Cardiology

## 2024-07-22 DIAGNOSIS — I4892 Unspecified atrial flutter: Secondary | ICD-10-CM

## 2024-07-22 NOTE — Telephone Encounter (Signed)
 Prescription refill request for Eliquis  received. Indication:afib Last office visit:1/25 Drm:wzzid labs Age: 78 Weight:61.8  kg  Prescription refilled

## 2024-08-07 DIAGNOSIS — Z1231 Encounter for screening mammogram for malignant neoplasm of breast: Secondary | ICD-10-CM | POA: Diagnosis not present

## 2024-08-11 ENCOUNTER — Other Ambulatory Visit: Payer: Self-pay | Admitting: Cardiology

## 2024-08-11 DIAGNOSIS — I480 Paroxysmal atrial fibrillation: Secondary | ICD-10-CM

## 2024-08-16 DIAGNOSIS — I10 Essential (primary) hypertension: Secondary | ICD-10-CM | POA: Diagnosis not present

## 2024-08-16 DIAGNOSIS — I739 Peripheral vascular disease, unspecified: Secondary | ICD-10-CM | POA: Diagnosis not present

## 2024-08-16 DIAGNOSIS — I4891 Unspecified atrial fibrillation: Secondary | ICD-10-CM | POA: Diagnosis not present

## 2024-08-16 DIAGNOSIS — M810 Age-related osteoporosis without current pathological fracture: Secondary | ICD-10-CM | POA: Diagnosis not present

## 2024-08-18 ENCOUNTER — Other Ambulatory Visit: Payer: Self-pay | Admitting: Cardiology

## 2024-08-18 NOTE — Telephone Encounter (Signed)
 Prescription refill request for Eliquis  received. Indication:afib Last office visit:1/25 Scr:0.63  2/25 Age: 78 Weight:61.8  kg  Prescription refilled

## 2024-09-15 DIAGNOSIS — I10 Essential (primary) hypertension: Secondary | ICD-10-CM | POA: Diagnosis not present

## 2024-09-15 DIAGNOSIS — M810 Age-related osteoporosis without current pathological fracture: Secondary | ICD-10-CM | POA: Diagnosis not present

## 2024-09-15 DIAGNOSIS — I739 Peripheral vascular disease, unspecified: Secondary | ICD-10-CM | POA: Diagnosis not present

## 2024-09-15 DIAGNOSIS — I4891 Unspecified atrial fibrillation: Secondary | ICD-10-CM | POA: Diagnosis not present

## 2024-10-15 DIAGNOSIS — I10 Essential (primary) hypertension: Secondary | ICD-10-CM | POA: Diagnosis not present

## 2024-10-15 DIAGNOSIS — I4891 Unspecified atrial fibrillation: Secondary | ICD-10-CM | POA: Diagnosis not present

## 2024-10-15 DIAGNOSIS — I739 Peripheral vascular disease, unspecified: Secondary | ICD-10-CM | POA: Diagnosis not present

## 2024-10-16 DIAGNOSIS — M810 Age-related osteoporosis without current pathological fracture: Secondary | ICD-10-CM | POA: Diagnosis not present

## 2024-10-16 DIAGNOSIS — I4891 Unspecified atrial fibrillation: Secondary | ICD-10-CM | POA: Diagnosis not present

## 2024-10-16 DIAGNOSIS — I739 Peripheral vascular disease, unspecified: Secondary | ICD-10-CM | POA: Diagnosis not present

## 2024-10-16 DIAGNOSIS — I10 Essential (primary) hypertension: Secondary | ICD-10-CM | POA: Diagnosis not present

## 2024-11-03 DIAGNOSIS — D1801 Hemangioma of skin and subcutaneous tissue: Secondary | ICD-10-CM | POA: Diagnosis not present

## 2024-11-03 DIAGNOSIS — L821 Other seborrheic keratosis: Secondary | ICD-10-CM | POA: Diagnosis not present

## 2024-11-03 DIAGNOSIS — L570 Actinic keratosis: Secondary | ICD-10-CM | POA: Diagnosis not present

## 2024-11-03 DIAGNOSIS — L814 Other melanin hyperpigmentation: Secondary | ICD-10-CM | POA: Diagnosis not present

## 2024-11-03 DIAGNOSIS — L578 Other skin changes due to chronic exposure to nonionizing radiation: Secondary | ICD-10-CM | POA: Diagnosis not present

## 2024-11-07 ENCOUNTER — Other Ambulatory Visit: Payer: Self-pay | Admitting: Cardiology

## 2024-11-07 DIAGNOSIS — I480 Paroxysmal atrial fibrillation: Secondary | ICD-10-CM

## 2024-11-12 ENCOUNTER — Telehealth: Payer: Self-pay | Admitting: Cardiology

## 2024-11-12 DIAGNOSIS — I10 Essential (primary) hypertension: Secondary | ICD-10-CM

## 2024-11-14 DIAGNOSIS — I4891 Unspecified atrial fibrillation: Secondary | ICD-10-CM | POA: Diagnosis not present

## 2024-11-14 DIAGNOSIS — I739 Peripheral vascular disease, unspecified: Secondary | ICD-10-CM | POA: Diagnosis not present

## 2024-11-14 DIAGNOSIS — I10 Essential (primary) hypertension: Secondary | ICD-10-CM | POA: Diagnosis not present

## 2024-11-15 DIAGNOSIS — I10 Essential (primary) hypertension: Secondary | ICD-10-CM | POA: Diagnosis not present

## 2024-11-15 DIAGNOSIS — I4891 Unspecified atrial fibrillation: Secondary | ICD-10-CM | POA: Diagnosis not present

## 2024-11-15 DIAGNOSIS — M810 Age-related osteoporosis without current pathological fracture: Secondary | ICD-10-CM | POA: Diagnosis not present

## 2024-11-15 DIAGNOSIS — I739 Peripheral vascular disease, unspecified: Secondary | ICD-10-CM | POA: Diagnosis not present

## 2024-11-17 NOTE — Telephone Encounter (Signed)
 Patient is following up regarding this request.

## 2024-11-17 NOTE — Telephone Encounter (Signed)
 Refill sent

## 2024-11-26 ENCOUNTER — Encounter: Payer: Self-pay | Admitting: Cardiology

## 2025-04-20 ENCOUNTER — Ambulatory Visit: Admitting: Cardiology
